# Patient Record
Sex: Female | Born: 1985 | Race: White | Hispanic: No | Marital: Married | Smoking: Never smoker
Health system: Southern US, Community
[De-identification: ages and names within clinical notes are randomized; demographics above are authoritative.]

## PROBLEM LIST (undated history)

## (undated) DIAGNOSIS — Z789 Other specified health status: Secondary | ICD-10-CM

## (undated) HISTORY — DX: Other specified health status: Z78.9

---

## 2015-12-21 HISTORY — PX: DILATION AND CURETTAGE OF UTERUS: SHX78

## 2019-08-13 ENCOUNTER — Other Ambulatory Visit: Payer: Self-pay

## 2019-08-13 DIAGNOSIS — Z20822 Contact with and (suspected) exposure to covid-19: Secondary | ICD-10-CM

## 2019-08-14 LAB — NOVEL CORONAVIRUS, NAA: SARS-CoV-2, NAA: NOT DETECTED

## 2020-04-05 ENCOUNTER — Ambulatory Visit: Payer: Self-pay | Attending: Internal Medicine

## 2020-04-05 DIAGNOSIS — Z23 Encounter for immunization: Secondary | ICD-10-CM

## 2020-04-05 NOTE — Progress Notes (Signed)
   Covid-19 Vaccination Clinic  Name:  Shakela Donati    MRN: [id number] DOB: [birth year]  04/05/2020  Ms. Rovira was observed post Covid-19 immunization for 15 minutes without incident. She was provided with Vaccine Information Sheet and instruction to access the V-Safe system.   Ms. Sobecki was instructed to call 911 with any severe reactions post vaccine: Marland Kitchen Difficulty breathing  . Swelling of face and throat  . A fast heartbeat  . A bad rash all over body  . Dizziness and weakness   Immunizations Administered    Name Date Dose VIS Date Route   Pfizer COVID-19 Vaccine 04/05/2020  1:46 PM 0.3 mL 11/30/2019 Intramuscular   Manufacturer: [employer], [employer]   Lot: [id number]   NDC: [address]-2

## 2020-04-26 ENCOUNTER — Ambulatory Visit: Payer: Self-pay | Attending: Internal Medicine

## 2020-04-26 DIAGNOSIS — Z23 Encounter for immunization: Secondary | ICD-10-CM

## 2020-04-26 NOTE — Progress Notes (Signed)
   Covid-19 Vaccination Clinic  Name:  Janet Ruiz    MRN: [id number] DOB: Mar 15, 1986  04/26/2020  [name] was observed post Covid-19 immunization for 15 minutes without incident. She was provided with Vaccine Information Sheet and instruction to access the V-Safe system.   [name] was instructed to call 911 with any severe reactions post vaccine: Marland Kitchen Difficulty breathing  . Swelling of face and throat  . A fast heartbeat  . A bad rash all over body  . Dizziness and weakness   Immunizations Administered    Name Date Dose VIS Date Route   Pfizer COVID-19 Vaccine 04/26/2020  1:31 PM 0.3 mL 02/13/2019 Intramuscular   Manufacturer: [employer], [employer]   Lot: [id number]   NDC: [address]-4

## 2021-07-28 ENCOUNTER — Other Ambulatory Visit (HOSPITAL_COMMUNITY)
Admission: RE | Admit: 2021-07-28 | Discharge: 2021-07-28 | Disposition: A | Discharge status: Home / Self Care | Payer: Self-pay | Source: Ambulatory Visit | Attending: Obstetrics and Gynecology | Admitting: Obstetrics and Gynecology

## 2021-07-28 ENCOUNTER — Encounter: Payer: Self-pay | Admitting: Obstetrics and Gynecology

## 2021-07-28 ENCOUNTER — Ambulatory Visit (INDEPENDENT_AMBULATORY_CARE_PROVIDER_SITE_OTHER): Payer: 59 | Admitting: Obstetrics and Gynecology

## 2021-07-28 ENCOUNTER — Other Ambulatory Visit: Payer: Self-pay

## 2021-07-28 VITALS — BP 118/74 | HR 83 | Ht 69.0 in | Wt 210.0 lb

## 2021-07-28 DIAGNOSIS — Z124 Encounter for screening for malignant neoplasm of cervix: Secondary | ICD-10-CM | POA: Insufficient documentation

## 2021-07-28 DIAGNOSIS — Z01419 Encounter for gynecological examination (general) (routine) without abnormal findings: Secondary | ICD-10-CM | POA: Insufficient documentation

## 2021-07-28 DIAGNOSIS — Z1331 Encounter for screening for depression: Secondary | ICD-10-CM

## 2021-07-28 DIAGNOSIS — Z1339 Encounter for screening examination for other mental health and behavioral disorders: Secondary | ICD-10-CM

## 2021-07-28 NOTE — Progress Notes (Signed)
Gynecology Annual Exam  PCP: Patient, No Pcp Per (Inactive)  Chief Complaint  Patient presents with   Gynecologic Exam    Annual - no concerns. RM 8    History of Present Illness:  Ms. Janet Ruiz is a 35 y.o. G2P1011 who LMP was Patient's last menstrual period was 07/13/2021., presents today for her annual examination.  Her menses are regular every 30-31 days, lasting 5 day(s).  Dysmenorrhea none. She does not have intermenstrual bleeding.  She is sexually active. She uses condoms. No pain with intercourse.   Last Pap: ~2 years.   Results were: no abnormalities /neg HPV DNA neg Hx of STDs: none  There is a FH of breast cancer in her mat and pat grandmothers. There is no FH of ovarian cancer. The patient does do self-breast exams.  Tobacco use: The patient denies current or previous tobacco use. Alcohol use: social drinker Exercise: 5-6 days per week  The patient wears seatbelts: yes.   The patient reports that domestic violence in her life is absent.   Past Medical History:  Diagnosis Date   No known health problems     Past Surgical History:  Procedure Laterality Date   DILATION AND CURETTAGE OF UTERUS  2017   after miscarriage    Prior to Admission medications   Medication Sig Start Date End Date Taking? Authorizing Provider  clindamycin (CLEOCIN T) 1 % lotion Apply topically every morning. 04/20/21   [provider]    Allergies  Allergen Reactions   Guaifenesin Hives, Itching and Swelling    And hives    Ibuprofen Hives, Itching and Swelling   Obstetric History: G2P1011  Social History   Socioeconomic History   Marital status: Married    Spouse name: Not on file   Number of children: Not on file   Years of education: Not on file   Highest education level: Not on file  Occupational History   Not on file  Tobacco Use   Smoking status: Never   Smokeless tobacco: Never  Vaping Use   Vaping Use: Never used  Substance and Sexual Activity    Alcohol use: Yes    Comment: occasionally   Drug use: Never   Sexual activity: Yes    Birth control/protection: Condom  Other Topics Concern   Not on file  Social History Narrative   Not on file   Social Determinants of Health   Financial Resource Strain: Not on file  Food Insecurity: Not on file  Transportation Needs: Not on file  Physical Activity: Not on file  Stress: Not on file  Social Connections: Not on file  Intimate Partner Violence: Not on file    Family History  Problem Relation Age of Onset   Esophageal cancer Father    Breast cancer Maternal Grandmother    Heart attack Maternal Grandfather    Breast cancer Paternal Grandmother    Stroke Paternal Grandfather    Heart attack Paternal Grandfather     Review of Systems  Constitutional: Negative.   HENT: Negative.    Eyes: Negative.   Respiratory: Negative.    Cardiovascular: Negative.   Gastrointestinal: Negative.   Genitourinary: Negative.   Musculoskeletal: Negative.   Skin: Negative.   Neurological: Negative.   Psychiatric/Behavioral: Negative.      Physical Exam BP 118/74   Pulse 83   Ht 5\' 9"  (1.753 m)   Wt 210 lb (95.3 kg)   LMP 07/13/2021   BMI 31.01 kg/m   Physical Exam  Constitutional:      General: She is not in acute distress.    Appearance: Normal appearance. She is well-developed.  Genitourinary:     Vulva and bladder normal.     Right Labia: No rash, tenderness, lesions, skin changes or Bartholin's cyst.    Left Labia: No tenderness, lesions, skin changes, Bartholin's cyst or rash.    No inguinal adenopathy present in the right or left side.    Pelvic Tanner Score: 5/5.    No vaginal discharge, erythema, tenderness or bleeding.      Right Adnexa: not tender, not full and no mass present.    Left Adnexa: not tender, not full and no mass present.    No cervical motion tenderness, discharge, lesion or polyp.     Uterus is not enlarged or tender.     No uterine mass detected.     Pelvic exam was performed with patient in the lithotomy position.  Breasts:    Right: No inverted nipple, mass, nipple discharge, skin change or tenderness.     Left: No inverted nipple, mass, nipple discharge, skin change or tenderness.  HENT:     Head: Normocephalic and atraumatic.  Eyes:     General: No scleral icterus.    Conjunctiva/sclera: Conjunctivae normal.  Neck:     Thyroid: No thyromegaly.  Cardiovascular:     Rate and Rhythm: Normal rate and regular rhythm.     Heart sounds: No murmur heard.   No friction rub. No gallop.  Pulmonary:     Effort: Pulmonary effort is normal. No respiratory distress.     Breath sounds: Normal breath sounds. No wheezing or rales.  Abdominal:     General: Bowel sounds are normal. There is no distension.     Palpations: Abdomen is soft. There is no mass.     Tenderness: There is no abdominal tenderness. There is no guarding or rebound.     Hernia: There is no hernia in the left inguinal area or right inguinal area.  Musculoskeletal:        General: No swelling or tenderness. Normal range of motion.     Cervical back: Normal range of motion and neck supple.  Lymphadenopathy:     Cervical: No cervical adenopathy.     Lower Body: No right inguinal adenopathy. No left inguinal adenopathy.  Neurological:     General: No focal deficit present.     Mental Status: She is alert and oriented to person, place, and time.     Cranial Nerves: No cranial nerve deficit.  Skin:    General: Skin is warm and dry.     Findings: No erythema or rash.  Psychiatric:        Mood and Affect: Mood normal.        Behavior: Behavior normal.        Judgment: Judgment normal.    Female chaperone present for pelvic and breast  portions of the physical exam  Results: AUDIT Questionnaire (screen for alcoholism): 1 PHQ-9: 1   Assessment: 35 y.o. G41P1011 female here for routine annual gynecologic examination  Plan: Problem List Items Addressed This Visit    None Visit Diagnoses     Women's annual routine gynecological examination    -  Primary   Relevant Orders   Cytology - PAP   Screening for depression       Screening for alcoholism       Pap smear for cervical cancer screening  Relevant Orders   Cytology - PAP      Screening: -- Blood pressure screen normal -- Weight screening: overweight: continue to monitor -- Depression screening negative (PHQ-9) -- Nutrition: normal -- cholesterol screening: not due for screening -- osteoporosis screening: not due -- tobacco screening: not using -- alcohol screening: AUDIT questionnaire indicates low-risk usage. -- family history of breast cancer screening: done. not at high risk. -- no evidence of domestic violence or intimate partner violence. -- STD screening: gonorrhea/chlamydia NAAT not collected per patient request. -- pap smear collected per ASCCP guidelines  Thomasene Mohair, MD 07/28/2021 9:10 AM

## 2021-07-31 LAB — CYTOLOGY - PAP
Comment: NEGATIVE
Diagnosis: NEGATIVE
High risk HPV: NEGATIVE

## 2021-12-15 ENCOUNTER — Encounter: Payer: Self-pay | Admitting: Licensed Practical Nurse

## 2021-12-15 ENCOUNTER — Ambulatory Visit (INDEPENDENT_AMBULATORY_CARE_PROVIDER_SITE_OTHER): Payer: 59 | Admitting: Licensed Practical Nurse

## 2021-12-15 ENCOUNTER — Other Ambulatory Visit: Payer: Self-pay

## 2021-12-15 VITALS — BP 114/70 | Ht 69.0 in | Wt 212.0 lb

## 2021-12-15 DIAGNOSIS — O0990 Supervision of high risk pregnancy, unspecified, unspecified trimester: Secondary | ICD-10-CM | POA: Insufficient documentation

## 2021-12-15 DIAGNOSIS — N926 Irregular menstruation, unspecified: Secondary | ICD-10-CM | POA: Diagnosis not present

## 2021-12-15 DIAGNOSIS — Z3480 Encounter for supervision of other normal pregnancy, unspecified trimester: Secondary | ICD-10-CM

## 2021-12-15 DIAGNOSIS — Z3A08 8 weeks gestation of pregnancy: Secondary | ICD-10-CM | POA: Diagnosis not present

## 2021-12-15 LAB — POCT URINALYSIS DIPSTICK OB
Glucose, UA: NEGATIVE
POC,PROTEIN,UA: NEGATIVE

## 2021-12-15 LAB — POCT URINE PREGNANCY: Preg Test, Ur: POSITIVE — AB

## 2021-12-15 NOTE — Progress Notes (Signed)
New Obstetric Patient H&P    Chief Complaint: "Desires prenatal care"   History of Present Illness: Patient is a 35 y.o. G3P1011 Not Hispanic or Latino female, presents with amenorrhea and positive home pregnancy test. Patient's last menstrual period was 10/20/2021. and based on her  LMP, her EDD is Estimated Date of Delivery 07/27/2022: Cycles last 5 days, are regular, and occur approximately every : 31 days. Her last pap smear was August 2022 and was no abnormalities.    She had a urine pregnancy test which was positive 3 week(s)  ago. Her last menstrual period was normal and lasted for  5 day(s). Since her LMP she claims she has experienced nausea and fatigue. She denies vaginal bleeding. Her past medical history is noncontributory. Her prior pregnancies are notable for  increased blood pressure at the end of pregnancy versus "white coat" syndrome".   Attempted to breastfeed first child but he did not latch, Janet Ruiz did try to pump but never produced more than 2 oz each time despite trying everything, she stopped pumping at 3 months. Reports her breast never got bigger in her last pregnancy.  Breast exam suggestive of IGT-a little wide spaced breast with snoopy shaped breasts. Discussed she could have IGT, but it is worth attempting to breastfeed and see what happens-but she may need to develop a plan to cover the milk she may not be able to produce.   Since her LMP, she admits to the use of tobacco products  yes She claims she has gained   not reviewed  pounds since the start of her pregnancy.  There are cats in the home in the home  NO   She admits close contact with children on a regular basis  yes  She has had chicken pox in the past yes She has had Tuberculosis exposures, symptoms, or previously tested positive for TB   no Current or past history of domestic violence. 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 Patient's age >/= 28 at [facility]  yes 2. Thalassemia ([place], [place], Mediterranean, or Asian background): MCV<80  yes 3. Neural tube defect (meningomyelocele, spina bifida, anencephaly)  no 4. Congenital heart defect  no  5. Down syndrome  no 6. Tay-Sachs (Jewish, [place]  no 7. Canavan's Disease  no 8. Sickle cell disease or trait (African)  no  9. Hemophilia or other blood disorders  no  10. Muscular dystrophy  no  11. Cystic fibrosis  no  12. Huntington's Chorea  no  13. Mental retardation/autism  no 14. Other inherited genetic or chromosomal disorder  no 15. Maternal metabolic disorder (DM, PKU, etc)  no 16. Patient or FOB with a child with a birth defect not listed above no  16a. Patient or FOB with a birth defect themselves no 17. Recurrent pregnancy loss, or stillbirth  no  18. Any medications since LMP other than prenatal vitamins (include vitamins, supplements, OTC meds, drugs, alcohol)  no 19. Any other genetic/environmental exposure to discuss  no  Husband's niece born with cleft lip palate and strabismus   Infection History:   1. Lives with someone with TB or TB exposed  no  2. Patient or partner has history of genital herpes  no 3. Rash  or viral illness since LMP  no 4. History of STI (GC, CT, HPV, syphilis, HIV)  no 5. History of recent travel :  no  Other pertinent information:  no     Review of Systems:10 point review of systems negative unless otherwise noted in HPI  Past Medical History:  There are no problems to display for this patient.   Past Surgical History:  Past Surgical History:  Procedure Laterality Date   DILATION AND CURETTAGE OF UTERUS  2017   after miscarriage    Gynecologic History: Patient's last menstrual period was 07/13/2021.  Obstetric History: G3P1011  Family History:   Family History  Problem Relation Age of Onset   Esophageal cancer Father    Breast cancer Maternal Grandmother    Heart attack Maternal Grandfather    Breast cancer Paternal Grandmother    Stroke Paternal Grandfather    Heart attack Paternal Grandfather     Social History:  Social History   Socioeconomic History   Marital status: Married    Spouse name: Not on file   Number of children: Not on file   Years of education: Not on file   Highest education level: Not on file  Occupational History   Not on file  Tobacco Use   Smoking status: Never   Smokeless tobacco: Never  Vaping Use   Vaping Use: Never used  Substance and Sexual Activity   Alcohol use: Yes    Comment: occasionally   Drug use: Never   Sexual activity: Yes    Birth control/protection: Condom  Other Topics Concern   Not on file  Social History Narrative   Not on file   Social Determinants of Health   Financial Resource Strain: Not on file  Food Insecurity: Not on file  Transportation Needs: Not on file  Physical Activity: Not on file  Stress: Not on file  Social Connections: Not on file  Intimate Partner Violence: Not on file    Allergies:  Allergies  Allergen Reactions   Guaifenesin Hives, Itching and Swelling    And hives    Ibuprofen Hives, Itching and Swelling    Medications: Prior to Admission medications   Medication Sig Start Date End Date Taking? Authorizing Provider  clindamycin (CLEOCIN T) 1 % lotion Apply topically every morning. 04/20/21  Yes [provider]    Physical Exam Vitals: Blood pressure 114/70, height 5\' 9"  (1.753 m), weight 212 lb (96.2 kg), last menstrual period 07/13/2021.  General: NAD HEENT: normocephalic, anicteric Thyroid: no enlargement, no palpable nodules Pulmonary: No increased work of breathing, CTAB Cardiovascular: RRR, distal pulses 2+ Breasts: no redness no masses, see above note regarding IGT  Abdomen: NABS, soft, non-tender,  non-distended.  Umbilicus without lesions.  No hepatomegaly, splenomegaly or masses palpable. No evidence of hernia  Genitourinary: deferred    Rectal: deferred Extremities: no edema, erythema, or tenderness Neurologic: Grossly intact Psychiatric: mood appropriate, affect full   Assessment: 35 y.o. G3P1011 at Unknown presenting to initiate prenatal care  Plan: 1) Avoid alcoholic beverages. 2) Patient encouraged not to smoke.  3) Discontinue the use of all non-medicinal drugs and chemicals.  4) Take prenatal vitamins daily.  5) Nutrition, food safety (fish, cheese advisories, and high nitrite foods) and exercise discussed. 6) Hospital and practice style discussed with cross coverage system.  7) Genetic Screening, such as with 1st Trimester Screening, cell free fetal DNA, AFP testing, and Ultrasound, as well as with amniocentesis and CVS as appropriate, is discussed with patient. At  the conclusion of today's visit patient requested genetic testing 8) Patient is asked about travel to areas at risk for the Zika virus, and counseled to avoid travel and exposure to mosquitoes or sexual partners who may have themselves been exposed to the virus. Testing is discussed, and will be ordered as appropriate.   Most of the prenatal panel with HA1C and CMP collected   Will return in 2 weeks for dating [place] and genetic screening  -Needs routine (no risk factors) UDS and hemoglobin fractionality at next apt.   Carie Caddy, CNM  [facility], [facility] 12/15/2021, 11:41 AM

## 2021-12-16 LAB — CBC/D/PLT+RPR+RH+ABO+RUBIGG...
Antibody Screen: NEGATIVE
Basophils Absolute: 0.1 10*3/uL (ref 0.0–0.2)
Basos: 0 %
EOS (ABSOLUTE): 0.1 10*3/uL (ref 0.0–0.4)
Eos: 0 %
HCV Ab: 0.2 s/co ratio (ref 0.0–0.9)
HIV Screen 4th Generation wRfx: NONREACTIVE
Hematocrit: 39 % (ref 34.0–46.6)
Hemoglobin: 12.6 g/dL (ref 11.1–15.9)
Hepatitis B Surface Ag: NEGATIVE
Immature Grans (Abs): 0 10*3/uL (ref 0.0–0.1)
Immature Granulocytes: 0 %
Lymphocytes Absolute: 2.2 10*3/uL (ref 0.7–3.1)
Lymphs: 17 %
MCH: 29.3 pg (ref 26.6–33.0)
MCHC: 32.3 g/dL (ref 31.5–35.7)
MCV: 91 fL (ref 79–97)
Monocytes Absolute: 0.7 10*3/uL (ref 0.1–0.9)
Monocytes: 6 %
Neutrophils Absolute: 9.4 10*3/uL — ABNORMAL HIGH (ref 1.4–7.0)
Neutrophils: 77 %
Platelets: 342 10*3/uL (ref 150–450)
RBC: 4.3 x10E6/uL (ref 3.77–5.28)
RDW: 11.8 % (ref 11.7–15.4)
RPR Ser Ql: NONREACTIVE
Rh Factor: POSITIVE
Rubella Antibodies, IGG: 1.19 index (ref >0.99)
Varicella zoster IgG: 1031 index (ref >165)
WBC: 12.4 10*3/uL — ABNORMAL HIGH (ref 3.4–10.8)

## 2021-12-16 LAB — COMPREHENSIVE METABOLIC PANEL
ALT: 51 IU/L — ABNORMAL HIGH (ref 0–32)
AST: 36 IU/L (ref 0–40)
Albumin/Globulin Ratio: 1.9 (ref 1.2–2.2)
Albumin: 4.6 g/dL (ref 3.8–4.8)
Alkaline Phosphatase: 84 IU/L (ref 44–121)
BUN/Creatinine Ratio: 15 (ref 9–23)
BUN: 9 mg/dL (ref 6–20)
Bilirubin Total: 0.3 mg/dL (ref 0.0–1.2)
CO2: 22 mmol/L (ref 20–29)
Calcium: 9.3 mg/dL (ref 8.7–10.2)
Chloride: 102 mmol/L (ref 96–106)
Creatinine, Ser: 0.61 mg/dL (ref 0.57–1.00)
Globulin, Total: 2.4 g/dL (ref 1.5–4.5)
Glucose: 106 mg/dL — ABNORMAL HIGH (ref 70–99)
Potassium: 4.7 mmol/L (ref 3.5–5.2)
Sodium: 138 mmol/L (ref 134–144)
Total Protein: 7 g/dL (ref 6.0–8.5)
eGFR: 120 mL/min/{1.73_m2} (ref >59)

## 2021-12-16 LAB — HEMOGLOBIN A1C
Est. average glucose Bld gHb Est-mCnc: 114 mg/dL
Hgb A1c MFr Bld: 5.6 % (ref 4.8–5.6)

## 2021-12-16 LAB — HCV INTERPRETATION

## 2021-12-17 ENCOUNTER — Encounter: Payer: Self-pay | Admitting: Licensed Practical Nurse

## 2021-12-17 ENCOUNTER — Other Ambulatory Visit: Payer: Self-pay | Admitting: Licensed Practical Nurse

## 2021-12-17 DIAGNOSIS — R748 Abnormal levels of other serum enzymes: Secondary | ICD-10-CM

## 2021-12-17 LAB — GC/CHLAMYDIA PROBE AMP
Chlamydia trachomatis, NAA: NEGATIVE
Neisseria Gonorrhoeae by PCR: NEGATIVE

## 2021-12-17 NOTE — Progress Notes (Signed)
Elevated ALT at NOB, liver US ordered, message sent through mychart to [facility] discussing plan.   Carie Caddy, CNM  [facility], [facility]  12/17/21  8:42 AM

## 2021-12-18 ENCOUNTER — Telehealth: Payer: Self-pay

## 2021-12-18 LAB — URINE CULTURE: Organism ID, Bacteria: NO GROWTH

## 2021-12-18 NOTE — Telephone Encounter (Signed)
Pt calling; is 8wks; has questions about genetic testing - the difference between them; has done Natera before and like it.  [phone]  Adv pt Inheritest is a carrier screening test for cystic fibrosis, spinal muscular atrophy and Fragile X; MaterniT21 tests for certain chromasomal abnormalities such at trisomy 18,21, 13,16, and 22. Adv MaterniT21 give gender whereas Inheritest does not; MaterniT21 is the most popular in our practice.  Pt asked about Gnomes and if we used Natera.  Adv to ask provider at her next appt about the Gnomes; adv we used to do natera but not sure we have the kits anymore. Pt states she'll go with MaterniT21.

## 2021-12-20 NOTE — L&D Delivery Note (Signed)
Obstetrical Delivery Note   Date of Delivery:   08/04/2022 Primary OB:   [facility] Gestational Age/EDD: [redacted]w[redacted]d (Dated by Corinna Capra) Reason for Admission: Postdate IOL Antepartum complications: AMA  Delivered By:   Siri Cole, CNM   Delivery Type:   spontaneous vaginal delivery  Delivery Details:   Admitted for postdates induction.  SVE 4-5/50/-2, Started on Pitocin at 1100.  AROM for mec around 1445, soon after epidural felt need to push, VE was anterior lip, while on right side gently pushed through a few contractions, 10cm at 0 to +1 station at 1618. Pt pushed effectively with each contraction repositioned to Left side, SVB of viable female at 1636, infant birthed OA to ROA with a compound hand. Shoulders followed quickly after birth. To mother's abd, vigorous cry, quick to pink up HR >100. Cord doubly clamped and cut by dad once pulsation ceased. Placenta delivered with maternal effort. On inspection of perineum a 2nd degree perineal laceration found and repaired with 2-0 Vicryl. Area hemostatic. Infant skin to skin with mother, both stable.  Anesthesia:    epidural Intrapartum complications: None GBS:    Negative Laceration:    2nd degree Episiotomy:    none Rectal exam:   deferred Placenta:    Delivered and expressed via active management. Intact: yes. To pathology: yes.  Delayed Cord Clamping: yes Estimated Blood Loss:   Baby:    Liveborn female, APGARs 9/10, weight pending gm  Carie Caddy, CNM  [facility], [facility]  @TODAY @  5:24 PM

## 2021-12-29 ENCOUNTER — Other Ambulatory Visit: Payer: Self-pay | Admitting: Licensed Practical Nurse

## 2021-12-29 ENCOUNTER — Ambulatory Visit
Admission: RE | Admit: 2021-12-29 | Discharge: 2021-12-29 | Disposition: A | Discharge status: Home / Self Care | Payer: 59 | Source: Ambulatory Visit | Attending: Licensed Practical Nurse | Admitting: Licensed Practical Nurse

## 2021-12-29 DIAGNOSIS — Z3481 Encounter for supervision of other normal pregnancy, first trimester: Secondary | ICD-10-CM | POA: Diagnosis not present

## 2021-12-29 DIAGNOSIS — Z3480 Encounter for supervision of other normal pregnancy, unspecified trimester: Secondary | ICD-10-CM | POA: Diagnosis present

## 2021-12-30 ENCOUNTER — Encounter: Payer: Self-pay | Admitting: Licensed Practical Nurse

## 2021-12-30 ENCOUNTER — Telehealth: Payer: Self-pay

## 2021-12-30 NOTE — Telephone Encounter (Signed)
Pt calling; had 1st u/s yesterday - dating u/s - will be [redacted]w[redacted]d tomorrow; MaterniT21 lab is scheduled for be drawn tomorrow; is this ok or too early?   [phone]

## 2021-12-31 ENCOUNTER — Other Ambulatory Visit: Payer: Self-pay

## 2022-01-01 ENCOUNTER — Ambulatory Visit
Admission: RE | Admit: 2022-01-01 | Discharge: 2022-01-01 | Disposition: A | Discharge status: Home / Self Care | Payer: 59 | Source: Ambulatory Visit | Attending: Licensed Practical Nurse | Admitting: Licensed Practical Nurse

## 2022-01-01 ENCOUNTER — Encounter: Payer: Self-pay | Admitting: Licensed Practical Nurse

## 2022-01-01 ENCOUNTER — Other Ambulatory Visit: Payer: Self-pay | Admitting: Licensed Practical Nurse

## 2022-01-01 DIAGNOSIS — R748 Abnormal levels of other serum enzymes: Secondary | ICD-10-CM | POA: Insufficient documentation

## 2022-01-01 DIAGNOSIS — Z1379 Encounter for other screening for genetic and chromosomal anomalies: Secondary | ICD-10-CM

## 2022-01-01 DIAGNOSIS — Z3480 Encounter for supervision of other normal pregnancy, unspecified trimester: Secondary | ICD-10-CM

## 2022-01-04 ENCOUNTER — Other Ambulatory Visit: Payer: 59

## 2022-01-04 ENCOUNTER — Other Ambulatory Visit: Payer: Self-pay

## 2022-01-04 DIAGNOSIS — Z3480 Encounter for supervision of other normal pregnancy, unspecified trimester: Secondary | ICD-10-CM

## 2022-01-04 DIAGNOSIS — Z1379 Encounter for other screening for genetic and chromosomal anomalies: Secondary | ICD-10-CM

## 2022-01-07 LAB — HGB FRACTIONATION CASCADE
Hgb A2: 2.9 % (ref 1.8–3.2)
Hgb A: 97.1 % (ref 96.4–98.8)
Hgb F: 0 % (ref 0.0–2.0)
Hgb S: 0 %

## 2022-01-08 ENCOUNTER — Encounter: Payer: Self-pay | Admitting: Licensed Practical Nurse

## 2022-01-08 LAB — MATERNIT 21 PLUS CORE, BLOOD
Fetal Fraction: 11
Result (T21): NEGATIVE
Trisomy 13 (Patau syndrome): NEGATIVE
Trisomy 18 (Edwards syndrome): NEGATIVE
Trisomy 21 (Down syndrome): NEGATIVE

## 2022-01-15 ENCOUNTER — Ambulatory Visit (INDEPENDENT_AMBULATORY_CARE_PROVIDER_SITE_OTHER): Payer: 59 | Admitting: Licensed Practical Nurse

## 2022-01-15 ENCOUNTER — Encounter: Payer: Self-pay | Admitting: Licensed Practical Nurse

## 2022-01-15 ENCOUNTER — Other Ambulatory Visit: Payer: Self-pay

## 2022-01-15 VITALS — BP 110/60 | Wt 208.0 lb

## 2022-01-15 DIAGNOSIS — Z3A12 12 weeks gestation of pregnancy: Secondary | ICD-10-CM

## 2022-01-15 DIAGNOSIS — Z3480 Encounter for supervision of other normal pregnancy, unspecified trimester: Secondary | ICD-10-CM

## 2022-01-15 NOTE — Progress Notes (Signed)
Routine Prenatal Care Visit  Subjective  Janet Ruiz is a 36 y.o. G3P1011 at [redacted]w[redacted]d being seen today for ongoing prenatal care.  She is currently monitored for the following issues for this low-risk pregnancy and has Supervision of other normal pregnancy, antepartum on their problem list.  ----------------------------------------------------------------------------------- Patient reports no complaints. Occasional nausea, but otherwise feels good.  Traveling to [place] soon to help care for her sister's new baby.  Travel precautions reviewed.   .  .   Pincus Large Fluid denies.  ----------------------------------------------------------------------------------- The following portions of the patient's history were reviewed and updated as appropriate: allergies, current medications, past family history, past medical history, past social history, past surgical history and problem list. Problem list updated.  Objective  Blood pressure 110/60, weight 208 lb (94.3 kg), last menstrual period 10/20/2021. Pregravid weight 212 lb (96.2 kg) Total Weight Gain -4 lb (-1.814 kg) Urinalysis: Urine Protein    Urine Glucose    Fetal Status:           General:  Alert, oriented and cooperative. Patient is in no acute distress.  Skin: Skin is warm and dry. No rash noted.   Cardiovascular: Normal heart rate noted  Respiratory: Normal respiratory effort, no problems with respiration noted  Abdomen: Soft, gravid, appropriate for gestational age.       Pelvic:  Cervical exam deferred        Extremities: Normal range of motion.     Mental Status: Normal mood and affect. Normal behavior. Normal judgment and thought content.   Assessment   36 y.o. G3P1011 at [redacted]w[redacted]d by  07/27/2022, by Last Menstrual Period presenting for routine prenatal visit  Plan   prgenancy 3 Problems (from 12/15/21 to present)     Problem Noted Resolved   Supervision of other normal pregnancy, antepartum 12/15/2021 by Ellwood Sayers, CNM No   Overview Signed 12/15/2021 11:57 AM by Ellwood Sayers, CNM     Nursing Staff Provider  Office Location  [facility] Dating    Language  English Anatomy US    Flu Vaccine  2022 Genetic Screen  NIPS:   TDaP vaccine    Hgb A1C or  GTT Early : Third trimester :   Covid 03/2020    LAB RESULTS   Rhogam   Blood Type     Feeding Plan  Antibody    Contraception  Rubella    Circumcision  RPR     Pediatrician   HBsAg     Support Person  HIV    Prenatal Classes  Varicella     GBS  (For PCN allergy, check sensitivities)   BTL Consent     VBAC Consent  Pap      Hgb Electro    Pelvis Tested  CF      SMA                    General obstetric precautions including but not limited to vaginal bleeding, contractions, leaking of fluid and fetal movement were reviewed in detail with the patient. Please refer to After Visit Summary for other counseling recommendations.   Return in about 4 weeks (around 02/12/2022) for ROB.  Anatomy US ordered  Carie Caddy, CNM  [facility], [facility]  01/15/22  10:38 AM

## 2022-01-15 NOTE — Progress Notes (Signed)
ROB- no concerns 

## 2022-02-19 ENCOUNTER — Encounter: Payer: Self-pay | Admitting: Licensed Practical Nurse

## 2022-02-19 ENCOUNTER — Ambulatory Visit (INDEPENDENT_AMBULATORY_CARE_PROVIDER_SITE_OTHER): Payer: 59 | Admitting: Licensed Practical Nurse

## 2022-02-19 ENCOUNTER — Other Ambulatory Visit: Payer: Self-pay

## 2022-02-19 VITALS — BP 118/62 | Wt 208.0 lb

## 2022-02-19 DIAGNOSIS — Z3A17 17 weeks gestation of pregnancy: Secondary | ICD-10-CM

## 2022-02-19 DIAGNOSIS — Z3480 Encounter for supervision of other normal pregnancy, unspecified trimester: Secondary | ICD-10-CM

## 2022-02-19 LAB — POCT URINALYSIS DIPSTICK OB
Glucose, UA: NEGATIVE
POC,PROTEIN,UA: NEGATIVE

## 2022-02-19 NOTE — Addendum Note (Signed)
Addended by: Burtis Junes on: 02/19/2022 08:37 AM ? ? Modules accepted: Orders ? ?

## 2022-02-19 NOTE — Progress Notes (Signed)
Routine Prenatal Care Visit ? ?Subjective  ?Janet Ruiz is a 36 y.o. G3P1011 at [redacted]w[redacted]d being seen today for ongoing prenatal care.  She is currently monitored for the following issues for this low-risk pregnancy and has Supervision of other normal pregnancy, antepartum on their problem list.  ?----------------------------------------------------------------------------------- ?Patient reports nausea improving, only present when she is brushing her teeth.  Modo is good.  Has been walking.  Plans to fly to [place] at some point.   ?Contractions: Not present. Vag. Bleeding: None.   . Leaking Fluid denies.  ?----------------------------------------------------------------------------------- ?The following portions of the patient's history were reviewed and updated as appropriate: allergies, current medications, past family history, past medical history, past social history, past surgical history and problem list. Problem list updated. ? ?Objective  ?Blood pressure 118/62, weight 208 lb (94.3 kg), last menstrual period 10/20/2021. ?Pregravid weight 212 lb (96.2 kg) Total Weight Gain -4 lb (-1.814 kg) ?Urinalysis: Urine Protein    Urine Glucose   ? ?Fetal Status: Fetal Heart Rate (bpm): 160        ? ?General:  Alert, oriented and cooperative. Patient is in no acute distress.  ?Skin: Skin is warm and dry. No rash noted.   ?Cardiovascular: Normal heart rate noted  ?Respiratory: Normal respiratory effort, no problems with respiration noted  ?Abdomen: Soft, gravid, appropriate for gestational age. Pain/Pressure: Absent     ?Pelvic:  Cervical exam deferred        ?Extremities: Normal range of motion.     ?Mental Status: Normal mood and affect. Normal behavior. Normal judgment and thought content.  ? ?Assessment  ? ?36 y.o. G3P1011 at [redacted]w[redacted]d by  07/27/2022, by Last Menstrual Period presenting for routine prenatal visit ? ?Plan  ? ?prgenancy 3 Problems (from 12/15/21 to present)   ? ? Problem Noted Resolved  ? Supervision  of other normal pregnancy, antepartum 12/15/2021 by Ellwood Sayers, CNM No  ? Overview Addendum 01/15/2022  8:35 AM by Ellwood Sayers, CNM  ?   ?Nursing Staff Provider  ?Office Location  [facility] Dating  [redacted]w[redacted]d 12/29/21  ?Language  English Anatomy US    ?Flu Vaccine  2022 Genetic Screen  NIPS: neg, XY  ?TDaP vaccine    Hgb A1C or  ?GTT Early :5.6Third trimester :   ?Covid 03/2020    LAB RESULTS   ?Rhogam  NA Blood Type B/Positive/-- (12/27 1041)   ?Feeding Plan breast Antibody Negative (12/27 1041)  ?Contraception  Rubella 1.19 (12/27 1041)  ?Circumcision  RPR Non Reactive (12/27 1041)   ?Pediatrician   HBsAg Negative (12/27 1041)   ?Support Person  HIV Non Reactive (12/27 1041)  ?Prenatal Classes  Varicella immune  ?  GBS  (For PCN allergy, check sensitivities)   ?BTL Consent     ?VBAC Consent  Pap  neg 2022  ?  Hgb Electro    ?Pelvis Tested  CF   ?   SMA   ?     ? ? ?  ?  ? ?  ?  ?general obstetric precautions including but not limited to vaginal bleeding, contractions, leaking of fluid and fetal movement were reviewed in detail with the patient. ?Please refer to After Visit Summary for other counseling recommendations.  ? ?Return in about 4 weeks (around 03/19/2022) for ROB. ? ?Anatomy US scheduled 3/10 ?Carie Caddy, CNM  ?Domingo Pulse, [facility]  ?02/19/22  ?8:32 AM  ? ? ?

## 2022-02-26 ENCOUNTER — Ambulatory Visit
Admission: RE | Admit: 2022-02-26 | Discharge: 2022-02-26 | Disposition: A | Discharge status: Home / Self Care | Payer: 59 | Source: Ambulatory Visit | Attending: Licensed Practical Nurse | Admitting: Licensed Practical Nurse

## 2022-02-26 DIAGNOSIS — Z3480 Encounter for supervision of other normal pregnancy, unspecified trimester: Secondary | ICD-10-CM

## 2022-02-26 DIAGNOSIS — Z3689 Encounter for other specified antenatal screening: Secondary | ICD-10-CM | POA: Insufficient documentation

## 2022-02-26 DIAGNOSIS — Z3A18 18 weeks gestation of pregnancy: Secondary | ICD-10-CM | POA: Insufficient documentation

## 2022-03-03 ENCOUNTER — Other Ambulatory Visit: Payer: Self-pay | Admitting: Licensed Practical Nurse

## 2022-03-03 ENCOUNTER — Encounter: Payer: Self-pay | Admitting: Licensed Practical Nurse

## 2022-03-03 DIAGNOSIS — Z3482 Encounter for supervision of other normal pregnancy, second trimester: Secondary | ICD-10-CM

## 2022-03-17 ENCOUNTER — Ambulatory Visit
Admission: RE | Admit: 2022-03-17 | Discharge: 2022-03-17 | Disposition: A | Discharge status: Home / Self Care | Payer: 59 | Source: Ambulatory Visit | Attending: Licensed Practical Nurse | Admitting: Licensed Practical Nurse

## 2022-03-17 DIAGNOSIS — Z3A20 20 weeks gestation of pregnancy: Secondary | ICD-10-CM | POA: Diagnosis not present

## 2022-03-17 DIAGNOSIS — Z3689 Encounter for other specified antenatal screening: Secondary | ICD-10-CM | POA: Diagnosis not present

## 2022-03-17 DIAGNOSIS — Z3482 Encounter for supervision of other normal pregnancy, second trimester: Secondary | ICD-10-CM | POA: Insufficient documentation

## 2022-03-19 ENCOUNTER — Ambulatory Visit (INDEPENDENT_AMBULATORY_CARE_PROVIDER_SITE_OTHER): Payer: 59 | Admitting: Obstetrics

## 2022-03-19 VITALS — BP 126/74 | Wt 209.0 lb

## 2022-03-19 DIAGNOSIS — Z3A34 34 weeks gestation of pregnancy: Secondary | ICD-10-CM

## 2022-03-19 DIAGNOSIS — Z3480 Encounter for supervision of other normal pregnancy, unspecified trimester: Secondary | ICD-10-CM

## 2022-03-19 NOTE — Progress Notes (Signed)
Anatomy scan f/u. No vb. No lof.  

## 2022-03-19 NOTE — Progress Notes (Signed)
Routine Prenatal Care Visit ? ?Subjective  ?Summar Janet Ruiz is a 36 y.o. G3P1011 at [redacted]w[redacted]d being seen today for ongoing prenatal care.  She is currently monitored for the following issues for this low-risk pregnancy and has Supervision of other normal pregnancy, antepartum on their problem list.  ?----------------------------------------------------------------------------------- ?Patient reports no complaints.   ?Contractions: Not present. Vag. Bleeding: None.   . Leaking Fluid denies.  ?----------------------------------------------------------------------------------- ?The following portions of the patient's history were reviewed and updated as appropriate: allergies, current medications, past family history, past medical history, past social history, past surgical history and problem list. Problem list updated. ? ?Objective  ?Blood pressure 126/74, weight 209 lb (94.8 kg), last menstrual period 10/20/2021. ?Pregravid weight 212 lb (96.2 kg) Total Weight Gain -3 lb (-1.361 kg) ?Urinalysis: Urine Protein    Urine Glucose   ? ?Fetal Status:          ? ?General:  Alert, oriented and cooperative. Patient is in no acute distress.  ?Skin: Skin is warm and dry. No rash noted.   ?Cardiovascular: Normal heart rate noted  ?Respiratory: Normal respiratory effort, no problems with respiration noted  ?Abdomen: Soft, gravid, appropriate for gestational age. Pain/Pressure: Absent     ?Pelvic:  Cervical exam deferred        ?Extremities: Normal range of motion.     ?Mental Status: Normal mood and affect. Normal behavior. Normal judgment and thought content.  ? ?Assessment  ? ?36 y.o. G3P1011 at [redacted]w[redacted]d by  07/27/2022, by Last Menstrual Period presenting for routine prenatal visit ? ?Plan  ? ?prgenancy 3 Problems (from 12/15/21 to present)   ? Problem Noted Resolved  ? Supervision of other normal pregnancy, antepartum 12/15/2021 by Allen Derry, CNM No  ? Overview Addendum 03/19/2022  8:25 AM by Imagene Riches, CNM  ?    ?Nursing Staff Provider  ?Office Location  [facility] Dating  [redacted]w[redacted]d 12/29/21  ?Language  English Anatomy US  Normal female  ?Flu Vaccine  2022 Genetic Screen  NIPS: neg, XY  ?TDaP vaccine    Hgb A1C or  ?GTT Early :5.6Third trimester :   ?Covid 03/2020    LAB RESULTS   ?Rhogam  NA Blood Type B/Positive/-- (12/27 1041)   ?Feeding Plan breast Antibody Negative (12/27 1041)  ?Contraception  Rubella 1.19 (12/27 1041)  ?Circumcision  RPR Non Reactive (12/27 1041)   ?Pediatrician   HBsAg Negative (12/27 1041)   ?Support Person  HIV Non Reactive (12/27 1041)  ?Prenatal Classes  Varicella immune  ?  GBS  (For PCN allergy, check sensitivities)   ?BTL Consent     ?VBAC Consent  Pap  neg 2022  ?  Hgb Electro    ?Pelvis Tested  CF   ?   SMA   ?     ? ? ?  ?  ?  ?  ? ?Preterm labor symptoms and general obstetric precautions including but not limited to vaginal bleeding, contractions, leaking of fluid and fetal movement were reviewed in detail with the patient. ?Please refer to After Visit Summary for other counseling recommendations.  ?We reviewed her anatomy scan. ? ?Return in about 4 weeks (around 04/16/2022) for return OB. ? ?Imagene Riches, CNM  ?03/19/2022 8:25 AM   ? ?

## 2022-04-13 ENCOUNTER — Ambulatory Visit (INDEPENDENT_AMBULATORY_CARE_PROVIDER_SITE_OTHER): Payer: 59 | Admitting: Obstetrics

## 2022-04-13 VITALS — BP 122/70 | Wt 213.0 lb

## 2022-04-13 DIAGNOSIS — Z3480 Encounter for supervision of other normal pregnancy, unspecified trimester: Secondary | ICD-10-CM

## 2022-04-13 DIAGNOSIS — Z3A25 25 weeks gestation of pregnancy: Secondary | ICD-10-CM

## 2022-04-13 NOTE — Progress Notes (Signed)
Routine Prenatal Care Visit ? ?Subjective  ?Janet Ruiz is a 36 y.o. G3P1011 at [redacted]w[redacted]d being seen today for ongoing prenatal care.  She is currently monitored for the following issues for this low-risk pregnancy and has Supervision of other normal pregnancy, antepartum on their problem list.  ?----------------------------------------------------------------------------------- ?Patient reports no complaints.  She is taking vacation in a few weeks. Will adjust when she does her 28 wk labs. Still deciding on a name. She likes Neldon Labella; husband is not so sure. ?Contractions: Not present. Vag. Bleeding: None.  Movement: Present. Leaking Fluid denies.  ?----------------------------------------------------------------------------------- ?The following portions of the patient's history were reviewed and updated as appropriate: allergies, current medications, past family history, past medical history, past social history, past surgical history and problem list. Problem list updated. ? ?Objective  ?Blood pressure 122/70, weight 213 lb (96.6 kg), last menstrual period 10/20/2021. ?Pregravid weight 212 lb (96.2 kg) Total Weight Gain 1 lb (0.454 kg) ?Urinalysis: Urine Protein    Urine Glucose   ? ?Fetal Status:     Movement: Present    ? ?General:  Alert, oriented and cooperative. Patient is in no acute distress.  ?Skin: Skin is warm and dry. No rash noted.   ?Cardiovascular: Normal heart rate noted  ?Respiratory: Normal respiratory effort, no problems with respiration noted  ?Abdomen: Soft, gravid, appropriate for gestational age. Pain/Pressure: Absent     ?Pelvic:  Cervical exam deferred        ?Extremities: Normal range of motion.     ?Mental Status: Normal mood and affect. Normal behavior. Normal judgment and thought content.  ? ?Assessment  ? ?36 y.o. G3P1011 at [redacted]w[redacted]d by  07/27/2022, by Last Menstrual Period presenting for routine prenatal visit ? ?Plan  ? ?prgenancy 3 Problems (from 12/15/21 to present)   ? Problem Noted  Resolved  ? Supervision of other normal pregnancy, antepartum 12/15/2021 by Ellwood Sayers, CNM No  ? Overview Addendum 03/19/2022  8:25 AM by Mirna Mires, CNM  ?   ?Nursing Staff Provider  ?Office Location  [facility] Dating  [redacted]w[redacted]d 12/29/21  ?Language  English Anatomy US  Normal female  ?Flu Vaccine  2022 Genetic Screen  NIPS: neg, XY  ?TDaP vaccine    Hgb A1C or  ?GTT Early :5.6Third trimester :   ?Covid 03/2020    LAB RESULTS   ?Rhogam  NA Blood Type B/Positive/-- (12/27 1041)   ?Feeding Plan breast Antibody Negative (12/27 1041)  ?Contraception  Rubella 1.19 (12/27 1041)  ?Circumcision  RPR Non Reactive (12/27 1041)   ?Pediatrician   HBsAg Negative (12/27 1041)   ?Support Person  HIV Non Reactive (12/27 1041)  ?Prenatal Classes  Varicella immune  ?  GBS  (For PCN allergy, check sensitivities)   ?BTL Consent     ?VBAC Consent  Pap  neg 2022  ?  Hgb Electro    ?Pelvis Tested  CF   ?   SMA   ?     ? ? ?  ?  ?  ?  ? ?Preterm labor symptoms and general obstetric precautions including but not limited to vaginal bleeding, contractions, leaking of fluid and fetal movement were reviewed in detail with the patient. ?Please refer to After Visit Summary for other counseling recommendations.  ? ?Return in about 3 weeks (around 05/04/2022) for return OB, glucose testing. ? ?Mirna Mires, CNM  ?04/13/2022 10:39 AM   ? ?

## 2022-04-13 NOTE — Progress Notes (Signed)
No vb. No lof.  

## 2022-04-17 ENCOUNTER — Encounter: Payer: Self-pay | Admitting: Obstetrics

## 2022-04-26 ENCOUNTER — Encounter: Payer: 59 | Admitting: Licensed Practical Nurse

## 2022-04-26 ENCOUNTER — Other Ambulatory Visit: Payer: 59

## 2022-04-27 ENCOUNTER — Encounter: Payer: Self-pay | Admitting: Family Medicine

## 2022-04-27 ENCOUNTER — Other Ambulatory Visit: Payer: 59

## 2022-04-27 ENCOUNTER — Encounter: Payer: 59 | Admitting: Family Medicine

## 2022-04-27 ENCOUNTER — Ambulatory Visit (INDEPENDENT_AMBULATORY_CARE_PROVIDER_SITE_OTHER): Payer: 59 | Admitting: Family Medicine

## 2022-04-27 VITALS — BP 120/62 | Wt 213.0 lb

## 2022-04-27 DIAGNOSIS — Z3A27 27 weeks gestation of pregnancy: Secondary | ICD-10-CM | POA: Diagnosis not present

## 2022-04-27 DIAGNOSIS — O09523 Supervision of elderly multigravida, third trimester: Secondary | ICD-10-CM

## 2022-04-27 DIAGNOSIS — Z3480 Encounter for supervision of other normal pregnancy, unspecified trimester: Secondary | ICD-10-CM | POA: Diagnosis not present

## 2022-04-27 DIAGNOSIS — Z23 Encounter for immunization: Secondary | ICD-10-CM | POA: Diagnosis not present

## 2022-04-27 DIAGNOSIS — O0990 Supervision of high risk pregnancy, unspecified, unspecified trimester: Secondary | ICD-10-CM

## 2022-04-27 DIAGNOSIS — O99210 Obesity complicating pregnancy, unspecified trimester: Secondary | ICD-10-CM | POA: Insufficient documentation

## 2022-04-27 DIAGNOSIS — O09529 Supervision of elderly multigravida, unspecified trimester: Secondary | ICD-10-CM | POA: Insufficient documentation

## 2022-04-27 DIAGNOSIS — Z8759 Personal history of other complications of pregnancy, childbirth and the puerperium: Secondary | ICD-10-CM | POA: Insufficient documentation

## 2022-04-27 LAB — POCT URINALYSIS DIPSTICK OB
Glucose, UA: NEGATIVE
POC,PROTEIN,UA: NEGATIVE

## 2022-04-27 NOTE — Progress Notes (Signed)
? ? ?  PRENATAL VISIT NOTE ? ?Subjective:  ?Janet Ruiz is a 36 y.o. G3P1011 at [redacted]w[redacted]d being seen today for ongoing prenatal care.  She is currently monitored for the following issues for this low-risk pregnancy and has Supervision of high risk pregnancy, antepartum; AMA (advanced maternal age) multigravida 39+; History of 3rd/4th degree laceration ; Elevated BMI in pregnancy; and History of pregnancy induced hypertension on their problem list. ? ?Patient reports no complaints.  Contractions: Irritability. Vag. Bleeding: None.  Movement: Present. Denies leaking of fluid.  ? ?The following portions of the patient's history were reviewed and updated as appropriate: allergies, current medications, past family history, past medical history, past social history, past surgical history and problem list.  ? ?Objective:  ? ?Vitals:  ? 04/27/22 1027  ?BP: 120/62  ?Weight: 213 lb (96.6 kg)  ? ? ?Fetal Status: Fetal Heart Rate (bpm): 150 Fundal Height: 27 cm Movement: Present    ? ?General:  Alert, oriented and cooperative. Patient is in no acute distress.  ?Skin: Skin is warm and dry. No rash noted.   ?Cardiovascular: Normal heart rate noted  ?Respiratory: Normal respiratory effort, no problems with respiration noted  ?Abdomen: Soft, gravid, appropriate for gestational age.  Pain/Pressure: Absent     ?Pelvic: Cervical exam deferred        ?Extremities: Normal range of motion.  Edema: None  ?Mental Status: Normal mood and affect. Normal behavior. Normal judgment and thought content.  ? ?Assessment and Plan:  ?Pregnancy: G3P1011 at [redacted]w[redacted]d ? ?1. Supervision of other normal pregnancy, antepartum ?- POC Urinalysis Dipstick OB ?- Tdap vaccine greater than or equal to 7yo IM ? ?2. [redacted] weeks gestation of pregnancy ?- POC Urinalysis Dipstick OB ?- Tdap vaccine greater than or equal to 7yo IM ? ?3. Multigravida of advanced maternal age in third trimester ?Patient was never started on ASA  ?NIPS low risk ?Unlikely to be beneficial  now ?Had [facility] ? ?4. Supervision of high risk pregnancy, antepartum ?Changed to HR code due to BMI, AMA, history of PIH ?Informed patient of change  ?Going to [place] for the weekend, discussed getting out and walking every 2 hrs which will be no problem with a 5 yo and pregnant bladder :) ? ?5. Obstetric vaginal laceration with third degree perineal laceration, unspecified subtype ?Reviewed that patient had a 3rd/4th degree with last delivery ? ?6. Elevated BMI in pregnancy ?TWG=1 lb (0.454 kg) which is low normal but within 11-15lg goal ? ?7. History of pregnancy induced hypertension ?BP today is WNL ? ? ?Preterm labor symptoms and general obstetric precautions including but not limited to vaginal bleeding, contractions, leaking of fluid and fetal movement were reviewed in detail with the patient. ?Please refer to After Visit Summary for other counseling recommendations.  ? ?Return in about 2 weeks (around 05/11/2022) for Routine prenatal care. ? ?Future Appointments  ?Date Time Provider [place]  ?05/11/2022  9:35 AM Caren Macadam, MD [facility] None  ?05/25/2022  8:35 AM Caren Macadam, MD [facility] None  ?06/08/2022  8:15 AM Imagene Riches, CNM [facility] None  ? ? ?Caren Macadam, MD ?

## 2022-04-28 LAB — 28 WEEK RH+PANEL
Basophils Absolute: 0.1 10*3/uL (ref 0.0–0.2)
Basos: 0 %
EOS (ABSOLUTE): 0.1 10*3/uL (ref 0.0–0.4)
Eos: 1 %
Gestational Diabetes Screen: 128 mg/dL (ref 70–139)
HIV Screen 4th Generation wRfx: NONREACTIVE
Hematocrit: 32.5 % — ABNORMAL LOW (ref 34.0–46.6)
Hemoglobin: 10.7 g/dL — ABNORMAL LOW (ref 11.1–15.9)
Immature Grans (Abs): 0 10*3/uL (ref 0.0–0.1)
Immature Granulocytes: 0 %
Lymphocytes Absolute: 1.7 10*3/uL (ref 0.7–3.1)
Lymphs: 15 %
MCH: 29.6 pg (ref 26.6–33.0)
MCHC: 32.9 g/dL (ref 31.5–35.7)
MCV: 90 fL (ref 79–97)
Monocytes Absolute: 0.4 10*3/uL (ref 0.1–0.9)
Monocytes: 4 %
Neutrophils Absolute: 8.9 10*3/uL — ABNORMAL HIGH (ref 1.4–7.0)
Neutrophils: 80 %
Platelets: 310 10*3/uL (ref 150–450)
RBC: 3.61 x10E6/uL — ABNORMAL LOW (ref 3.77–5.28)
RDW: 11.9 % (ref 11.7–15.4)
RPR Ser Ql: NONREACTIVE
WBC: 11.3 10*3/uL — ABNORMAL HIGH (ref 3.4–10.8)

## 2022-04-28 IMAGING — US US ABDOMEN LIMITED
1 series · 15 of 25 positions shown · non-contrast
Comparison: None.

CLINICAL DATA: Elevated liver enzymes

EXAM:
ULTRASOUND ABDOMEN LIMITED RIGHT UPPER QUADRANT

[Series 1: us abdomen limited ruq · 15 of 40 slices shown]
[im 1/40]
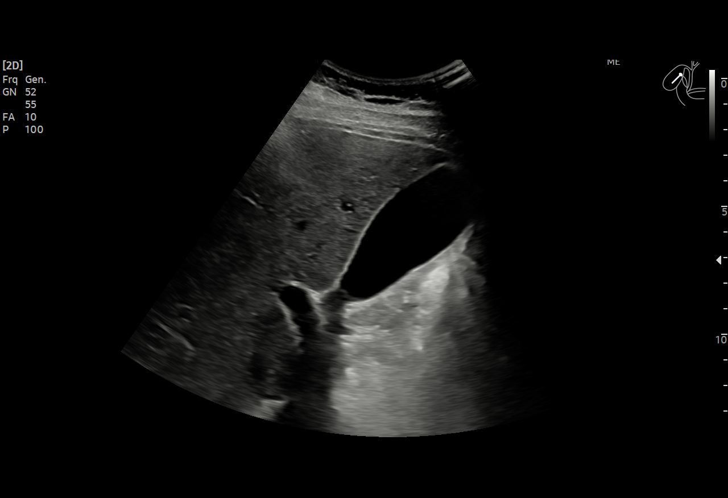
[im 4/40]
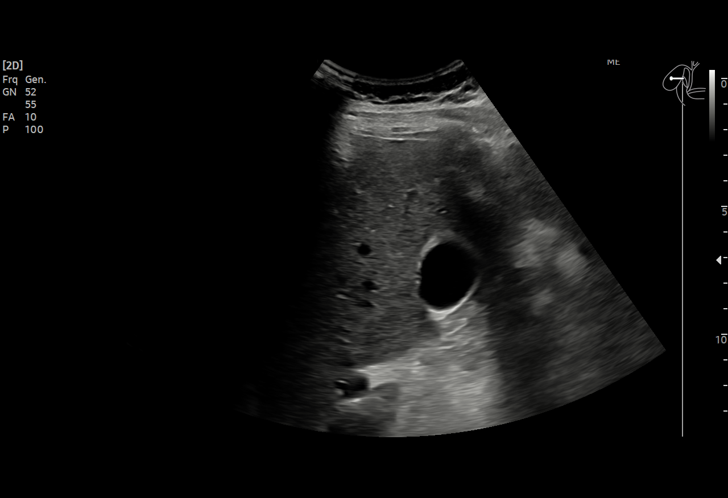
[im 7/40]
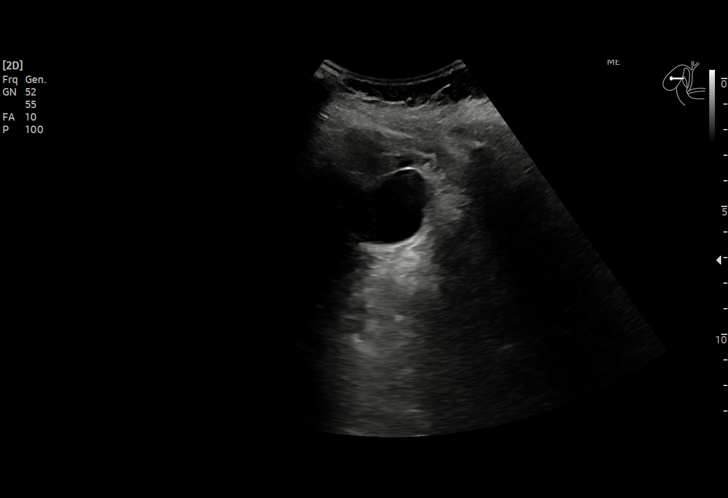
[im 9/40]
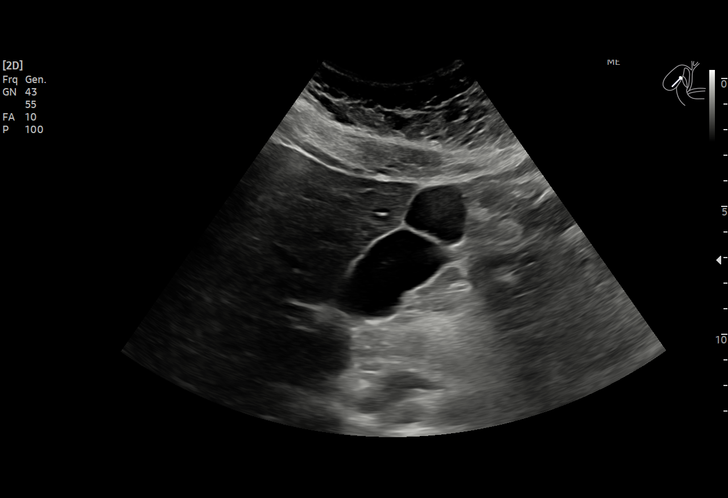
[im 12/40]
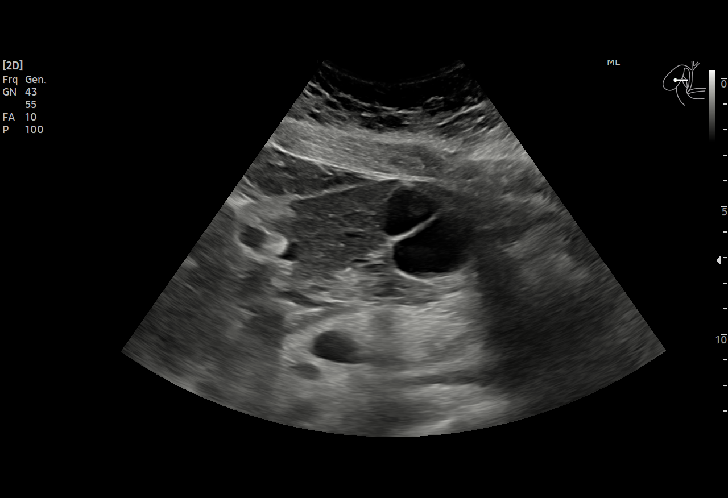
[im 15/40]
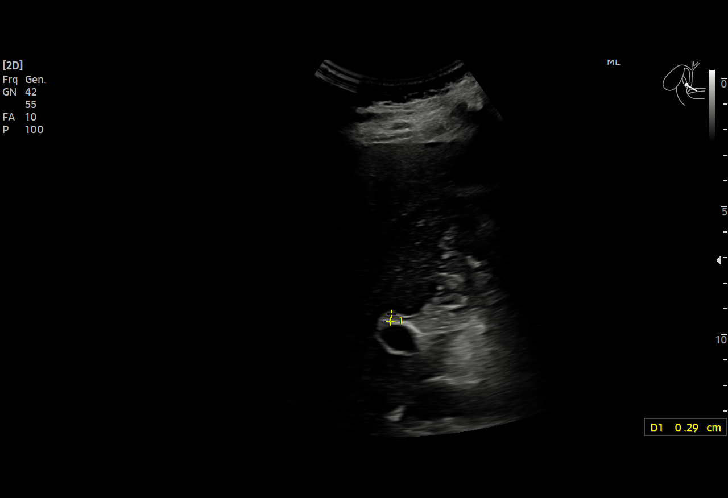
[im 17/40]
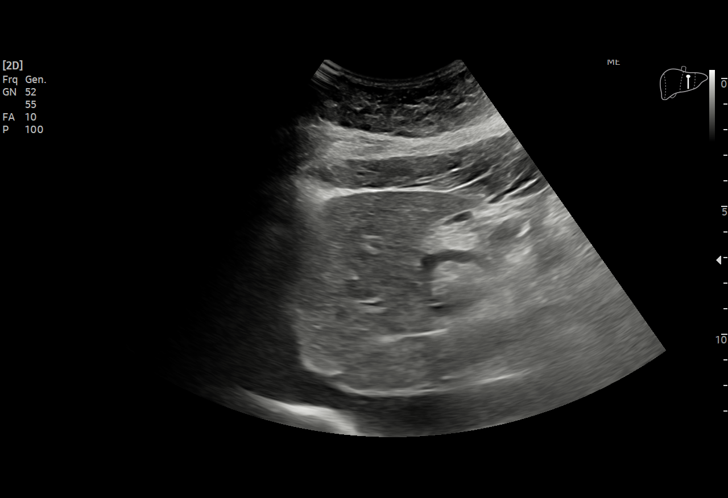
[im 20/40]
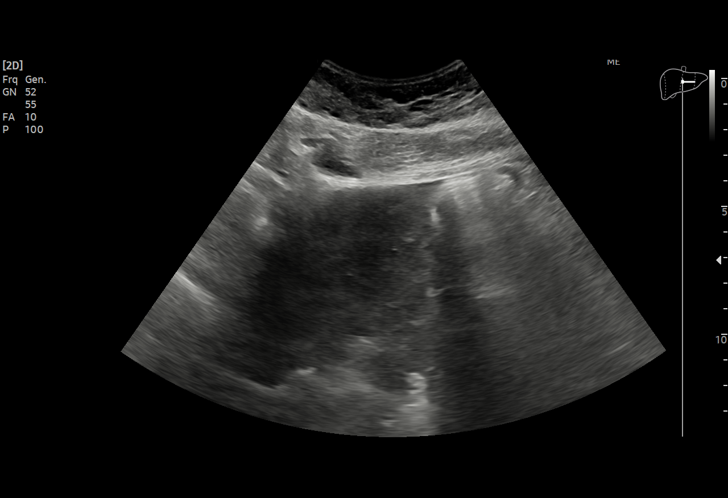
[im 23/40]
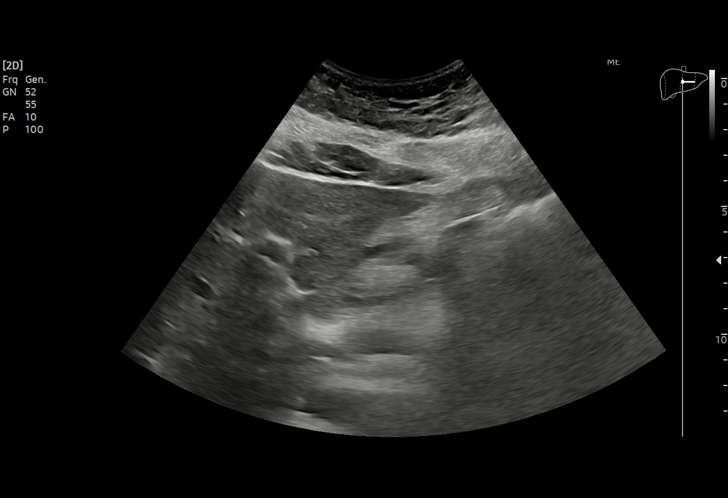
[im 25/40]
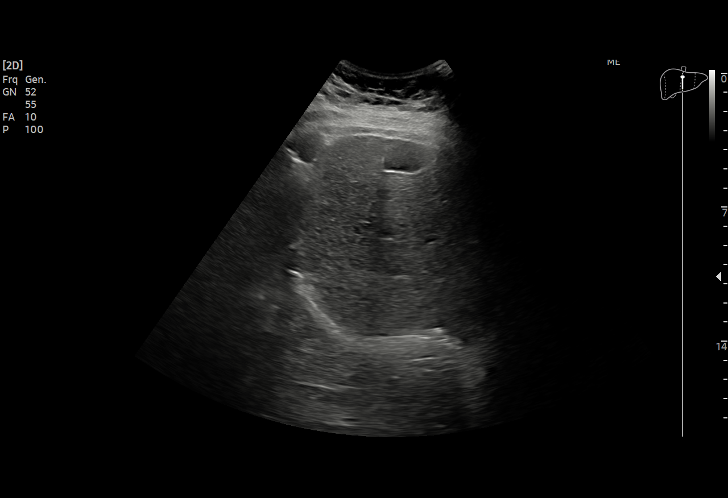
[im 28/40]
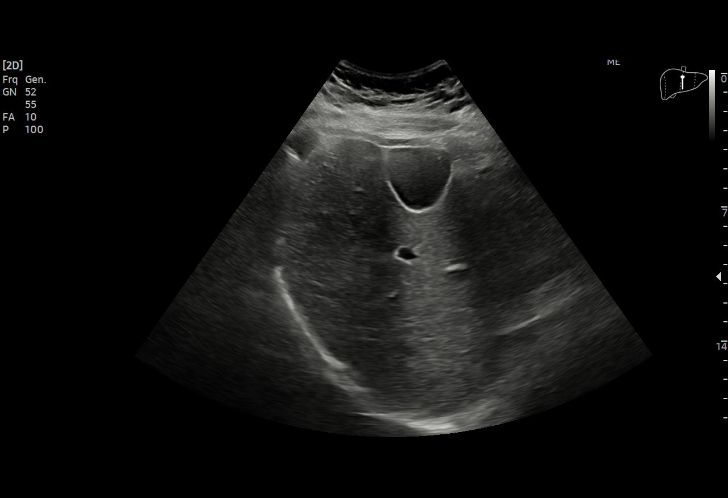
[im 31/40]
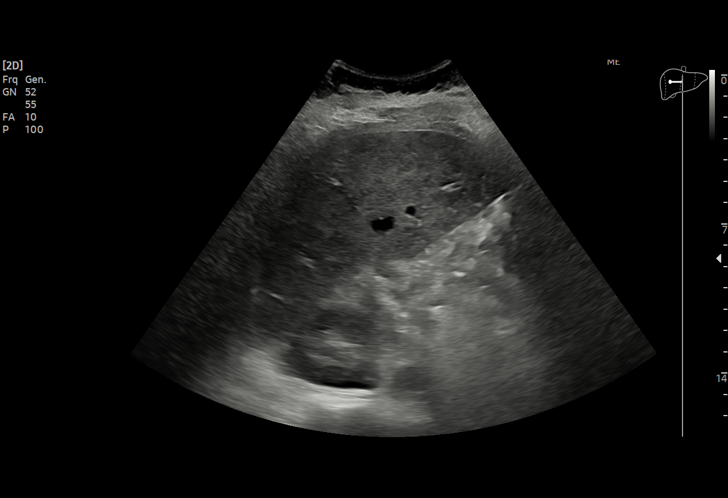
[im 33/40]
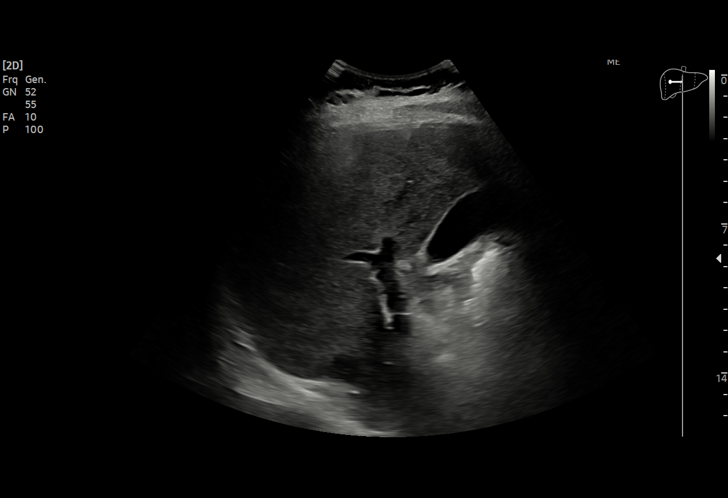
[im 36/40]
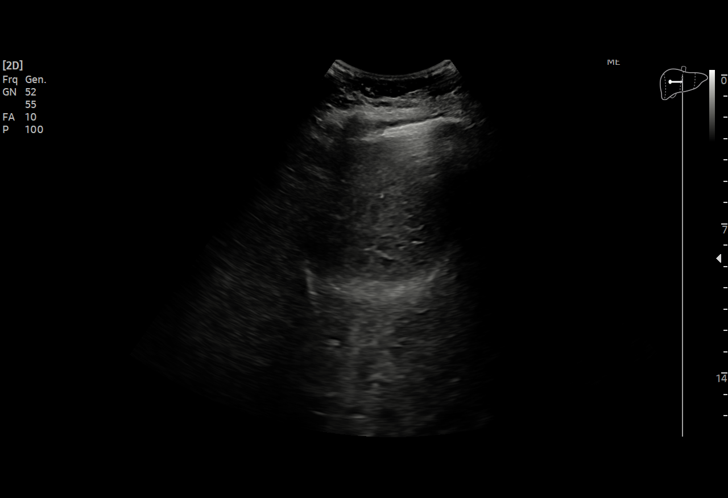
[im 40/40]
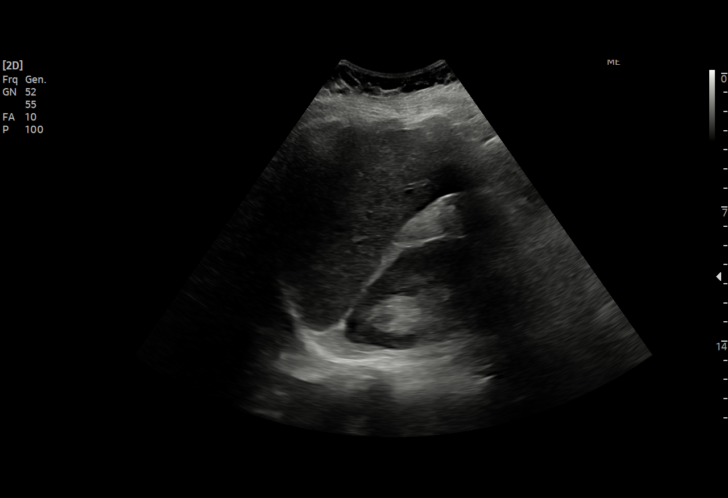

[15 of 25 positions shown; findings below may reference images not displayed]

FINDINGS: Gallbladder:

No gallstones or wall thickening visualized. No sonographic Murphy
sign noted by sonographer.

Common bile duct:

Diameter: 3 mm

Liver:

No focal lesion identified. Within normal limits in parenchymal
echogenicity. Portal vein is patent on color Doppler imaging with
normal direction of blood flow towards the liver.

Other: None.
IMPRESSION: No significant sonographic abnormality of the liver or gallbladder.

## 2022-05-11 ENCOUNTER — Encounter: Payer: Self-pay | Admitting: Family Medicine

## 2022-05-11 ENCOUNTER — Ambulatory Visit (INDEPENDENT_AMBULATORY_CARE_PROVIDER_SITE_OTHER): Payer: 59 | Admitting: Family Medicine

## 2022-05-11 VITALS — BP 118/62 | Wt 215.0 lb

## 2022-05-11 DIAGNOSIS — O99210 Obesity complicating pregnancy, unspecified trimester: Secondary | ICD-10-CM

## 2022-05-11 DIAGNOSIS — Z3A29 29 weeks gestation of pregnancy: Secondary | ICD-10-CM

## 2022-05-11 DIAGNOSIS — O0990 Supervision of high risk pregnancy, unspecified, unspecified trimester: Secondary | ICD-10-CM

## 2022-05-11 DIAGNOSIS — Z8759 Personal history of other complications of pregnancy, childbirth and the puerperium: Secondary | ICD-10-CM

## 2022-05-11 DIAGNOSIS — O09523 Supervision of elderly multigravida, third trimester: Secondary | ICD-10-CM

## 2022-05-11 LAB — POCT URINALYSIS DIPSTICK OB
Glucose, UA: NEGATIVE
POC,PROTEIN,UA: NEGATIVE

## 2022-05-11 NOTE — Progress Notes (Signed)
   PRENATAL VISIT NOTE  Subjective:  Janet Ruiz is a 36 y.o. G3P1011 at [redacted]w[redacted]d being seen today for ongoing prenatal care.  She is currently monitored for the following issues for this high-risk pregnancy and has Supervision of high risk pregnancy, antepartum; AMA (advanced maternal age) multigravida 18+; History of 3rd/4th degree laceration ; Elevated BMI in pregnancy; and History of pregnancy induced hypertension on their problem list.  Patient reports no complaints.  Contractions: Irritability. Vag. Bleeding: None.  Movement: Present. Denies leaking of fluid.   The following portions of the patient's history were reviewed and updated as appropriate: allergies, current medications, past family history, past medical history, past social history, past surgical history and problem list.   Objective:   Vitals:   05/11/22 0953  BP: 118/62  Weight: 215 lb (97.5 kg)    Fetal Status: Fetal Heart Rate (bpm): 145 Fundal Height: 30 cm Movement: Present     General:  Alert, oriented and cooperative. Patient is in no acute distress.  Skin: Skin is warm and dry. No rash noted.   Cardiovascular: Normal heart rate noted  Respiratory: Normal respiratory effort, no problems with respiration noted  Abdomen: Soft, gravid, appropriate for gestational age.  Pain/Pressure: Absent     Pelvic: Cervical exam deferred        Extremities: Normal range of motion.  Edema: None  Mental Status: Normal mood and affect. Normal behavior. Normal judgment and thought content.   Assessment and Plan:  Pregnancy: G3P1011 at [redacted]w[redacted]d 1. [redacted] weeks gestation of pregnancy - POC Urinalysis Dipstick OB  2. Supervision of high risk pregnancy, antepartum Up to date Reviewed FH growth and reviewed low threshold to get MFM [place] if concerned about fetal growth  3. Multigravida of advanced maternal age in third trimester LR NIP  4. History of pregnancy induced hypertension Was not put on ASA early in pregnancy. Not useful at  this point for prevention.  BP WNL, monitor closely  5. Elevated BMI in pregnancy TWG=3 lb (1.361 kg) which is at goal    Preterm labor symptoms and general obstetric precautions including but not limited to vaginal bleeding, contractions, leaking of fluid and fetal movement were reviewed in detail with the patient. Please refer to After Visit Summary for other counseling recommendations.   Return in about 2 weeks (around 05/25/2022) for Routine prenatal care.  Future Appointments  Date Time Provider [place]  05/25/2022  8:35 AM Caren Macadam, MD [facility] None  06/08/2022  8:15 AM Imagene Riches, CNM [facility] None  06/23/2022  8:15 AM Imagene Riches, CNM [facility] None    Caren Macadam, MD

## 2022-05-25 ENCOUNTER — Ambulatory Visit (INDEPENDENT_AMBULATORY_CARE_PROVIDER_SITE_OTHER): Payer: 59 | Admitting: Family Medicine

## 2022-05-25 DIAGNOSIS — I863 Vulval varices: Secondary | ICD-10-CM

## 2022-05-25 DIAGNOSIS — O99210 Obesity complicating pregnancy, unspecified trimester: Secondary | ICD-10-CM

## 2022-05-25 DIAGNOSIS — Z8759 Personal history of other complications of pregnancy, childbirth and the puerperium: Secondary | ICD-10-CM

## 2022-05-25 DIAGNOSIS — O0990 Supervision of high risk pregnancy, unspecified, unspecified trimester: Secondary | ICD-10-CM

## 2022-05-25 LAB — POCT URINALYSIS DIPSTICK OB
Glucose, UA: NEGATIVE
POC,PROTEIN,UA: NEGATIVE

## 2022-05-25 NOTE — Progress Notes (Signed)
ROB- possibly start of a bartholins cyst, feels like a bubble, not painful

## 2022-05-25 NOTE — Patient Instructions (Signed)
Safe Medications in Pregnancy    Colds/Coughs/Allergies: Benadryl (alcohol free) 25 mg every 6 hours as needed Breath right strips Claritin Cepacol throat lozenges Chloraseptic throat spray Cold-Eeze- up to three times per day Cough drops, alcohol free Flonase (by prescription only) Guaifenesin Mucinex Delsym Robitussin DM (plain only, alcohol free) Saline nasal spray/drops Sudafed (pseudoephedrine) & Actifed ** use only after [redacted] weeks gestation and if you do not have high blood pressure Tylenol Vicks Vaporub Zinc lozenges Zyrtec

## 2022-05-25 NOTE — Progress Notes (Signed)
   PRENATAL VISIT NOTE  Subjective:  Janet Ruiz is a 36 y.o. G3P1011 at [redacted]w[redacted]d being seen today for ongoing prenatal care.  She is currently monitored for the following issues for this high-risk pregnancy and has Supervision of high risk pregnancy, antepartum; AMA (advanced maternal age) multigravida 36+; History of 3rd/4th degree laceration ; Elevated BMI in pregnancy; History of pregnancy induced hypertension; and Vulvar varicose veins on their problem list.  Patient reports  cold sx, COVID neg .  Contractions: Irritability.  .  Movement: Present. Denies leaking of fluid.   The following portions of the patient's history were reviewed and updated as appropriate: allergies, current medications, past family history, past medical history, past social history, past surgical history and problem list.   Objective:   Vitals:   05/25/22 0831  BP: 110/70  Weight: 216 lb (98 kg)    Fetal Status: Fetal Heart Rate (bpm): 152 Fundal Height: 31 cm Movement: Present  Presentation: Vertex  General:  Alert, oriented and cooperative. Patient is in no acute distress.  Skin: Skin is warm and dry. No rash noted.   Cardiovascular: Normal heart rate noted  Respiratory: Normal respiratory effort, no problems with respiration noted  Abdomen: Soft, gravid, appropriate for gestational age.  Pain/Pressure: Absent     Pelvic: Genital exam with chaperone present-- + right vulvar varicosities         Extremities: Normal range of motion.  Edema: None  Mental Status: Normal mood and affect. Normal behavior. Normal judgment and thought content.   Assessment and Plan:  Pregnancy: G3P1011 at [redacted]w[redacted]d  1. Vulvar varicose veins Discussed possible vulvar compression if become painful  2. Obstetric vaginal laceration with third degree perineal laceration, unspecified subtype Keep in mind at delivery  3. Supervision of high risk pregnancy, antepartum UTD growth is appropriate Reviewed pregnancy safe meds for  cold/cough  4. History of pregnancy induced hypertension BP WNL today  5. Elevated BMI in pregnancy TWG=4 lb (1.814 kg)    Preterm labor symptoms and general obstetric precautions including but not limited to vaginal bleeding, contractions, leaking of fluid and fetal movement were reviewed in detail with the patient. Please refer to After Visit Summary for other counseling recommendations.   Return in about 2 weeks (around 06/08/2022).  Future Appointments  Date Time Provider [place]  06/07/2022  8:35 AM Caren Macadam, MD [facility] None  06/21/2022  8:15 AM Rod Can, CNM [facility] None  07/06/2022  8:15 AM Imagene Riches, CNM [facility] None    Caren Macadam, MD

## 2022-05-25 NOTE — Addendum Note (Signed)
Addended by: Donnetta Hail on: 05/25/2022 09:17 AM   Modules accepted: Orders

## 2022-05-27 ENCOUNTER — Encounter: Payer: Self-pay | Admitting: Family Medicine

## 2022-05-27 NOTE — Telephone Encounter (Signed)
Pt calling; was seen by [facility] Tues; cold is worse - cough, sore throat; was told can take Delsym but it doesn't seem to be working.  [phone]  Pt states she has sent a MyChart msg to [facility]; she has also gone to [facility] and dx'd with sinusitis; was rx'd amoxicillin.  Adv amoxicillin is okay as long as she is not allergies to it; can change to plain robitussin or plain musinex, Hall's cough drops, gargle with warm salt water, saline nasal spray. To let us know if sxs worsen.

## 2022-06-07 ENCOUNTER — Ambulatory Visit (INDEPENDENT_AMBULATORY_CARE_PROVIDER_SITE_OTHER): Payer: 59 | Admitting: Family Medicine

## 2022-06-07 VITALS — BP 120/62 | Wt 218.0 lb

## 2022-06-07 DIAGNOSIS — Z8759 Personal history of other complications of pregnancy, childbirth and the puerperium: Secondary | ICD-10-CM

## 2022-06-07 DIAGNOSIS — Z3A32 32 weeks gestation of pregnancy: Secondary | ICD-10-CM

## 2022-06-07 DIAGNOSIS — I863 Vulval varices: Secondary | ICD-10-CM

## 2022-06-07 DIAGNOSIS — O0990 Supervision of high risk pregnancy, unspecified, unspecified trimester: Secondary | ICD-10-CM

## 2022-06-07 DIAGNOSIS — O09523 Supervision of elderly multigravida, third trimester: Secondary | ICD-10-CM

## 2022-06-07 LAB — POCT URINALYSIS DIPSTICK OB
Glucose, UA: NEGATIVE
POC,PROTEIN,UA: NEGATIVE

## 2022-06-07 NOTE — Progress Notes (Signed)
   PRENATAL VISIT NOTE  Subjective:  Janet Ruiz is a 36 y.o. G3P1011 at [redacted]w[redacted]d being seen today for ongoing prenatal care.  She is currently monitored for the following issues for this high-risk pregnancy and has Supervision of high risk pregnancy, antepartum; AMA (advanced maternal age) multigravida 35+; History of 3rd/4th degree laceration ; Elevated BMI in pregnancy; History of pregnancy induced hypertension; and Vulvar varicose veins on their problem list.  Patient reports  reports heat rash - noted after being at the pool with children .  Contractions: Not present. Vag. Bleeding: None.  Movement: Present. Denies leaking of fluid.   The following portions of the patient's history were reviewed and updated as appropriate: allergies, current medications, past family history, past medical history, past social history, past surgical history and problem list.   Objective:   Vitals:   06/07/22 0830  BP: 120/62  Weight: 218 lb (98.9 kg)    Fetal Status: Fetal Heart Rate (bpm): 145 Fundal Height: 33 cm Movement: Present  Presentation: Vertex  General:  Alert, oriented and cooperative. Patient is in no acute distress.  Skin: Skin is warm and dry. No rash noted.   Cardiovascular: Normal heart rate noted  Respiratory: Normal respiratory effort, no problems with respiration noted  Abdomen: Soft, gravid, appropriate for gestational age.  Pain/Pressure: Absent     Pelvic: Cervical exam deferred        Extremities: Normal range of motion.  Edema: None  Mental Status: Normal mood and affect. Normal behavior. Normal judgment and thought content.   Assessment and Plan:  Pregnancy: G3P1011 at [redacted]w[redacted]d  1. History of pregnancy induced hypertension BP is wnl today  2. Supervision of high risk pregnancy, antepartum Up to date Desires BTS. Discussed LARC options today as well as vasectomy. Partner had testicular cancer and they are going to look into vasectomy options as well. She would like an  immediate pp tubal or tubal with CS if she needed one.   3. Obstetric vaginal laceration with third degree perineal laceration, unspecified subtype  4. Multigravida of advanced maternal age in third trimester  5. Vulvar varicose veins stable  Preterm labor symptoms and general obstetric precautions including but not limited to vaginal bleeding, contractions, leaking of fluid and fetal movement were reviewed in detail with the patient. Please refer to After Visit Summary for other counseling recommendations.   Return in about 2 weeks (around 06/21/2022) for Routine prenatal care.  Future Appointments  Date Time Provider Department Center  06/21/2022  8:15 AM Tresea Mall, CNM [facility] None  06/28/2022  9:35 AM Alvester Morin, Isa Rankin, MD [facility] None    Federico Flake, MD

## 2022-06-08 ENCOUNTER — Encounter: Payer: 59 | Admitting: Obstetrics

## 2022-06-09 ENCOUNTER — Encounter: Payer: 59 | Admitting: Advanced Practice Midwife

## 2022-06-21 ENCOUNTER — Ambulatory Visit (INDEPENDENT_AMBULATORY_CARE_PROVIDER_SITE_OTHER): Payer: 59 | Admitting: Advanced Practice Midwife

## 2022-06-21 ENCOUNTER — Encounter: Payer: Self-pay | Admitting: Advanced Practice Midwife

## 2022-06-21 VITALS — BP 118/74 | Wt 222.0 lb

## 2022-06-21 DIAGNOSIS — O09523 Supervision of elderly multigravida, third trimester: Secondary | ICD-10-CM

## 2022-06-21 DIAGNOSIS — O0993 Supervision of high risk pregnancy, unspecified, third trimester: Secondary | ICD-10-CM

## 2022-06-21 DIAGNOSIS — Z3A34 34 weeks gestation of pregnancy: Secondary | ICD-10-CM

## 2022-06-21 LAB — POCT URINALYSIS DIPSTICK OB
Glucose, UA: NEGATIVE
POC,PROTEIN,UA: NEGATIVE

## 2022-06-21 NOTE — Progress Notes (Signed)
Routine Prenatal Care Visit  Subjective  Janet Ruiz is a 36 y.o. G3P1011 at [redacted]w[redacted]d being seen today for ongoing prenatal care.  She is currently monitored for the following issues for this high-risk pregnancy and has Supervision of high risk pregnancy, antepartum; AMA (advanced maternal age) multigravida 35+; History of 3rd/4th degree laceration ; Elevated BMI in pregnancy; History of pregnancy induced hypertension; and Vulvar varicose veins on their problem list.  ----------------------------------------------------------------------------------- Patient reports no complaints.   Contractions: Not present. Vag. Bleeding: None.  Movement: Present. Leaking Fluid denies.  ----------------------------------------------------------------------------------- The following portions of the patient's history were reviewed and updated as appropriate: allergies, current medications, past family history, past medical history, past social history, past surgical history and problem list. Problem list updated.  Objective  Blood pressure 118/74, weight 222 lb (100.7 kg), last menstrual period 10/20/2021. Pregravid weight 212 lb (96.2 kg) Total Weight Gain 10 lb (4.536 kg) Urinalysis: Urine Protein Negative  Urine Glucose Negative  Fetal Status: Fetal Heart Rate (bpm): 147 Fundal Height: 35 cm Movement: Present     General:  Alert, oriented and cooperative. Patient is in no acute distress.  Skin: Skin is warm and dry. No rash noted.   Cardiovascular: Normal heart rate noted  Respiratory: Normal respiratory effort, no problems with respiration noted  Abdomen: Soft, gravid, appropriate for gestational age. Pain/Pressure: Absent     Pelvic:  Cervical exam deferred        Extremities: Normal range of motion.  Edema: None  Mental Status: Normal mood and affect. Normal behavior. Normal judgment and thought content.   Assessment   36 y.o. G3P1011 at [redacted]w[redacted]d by  07/27/2022, by Last Menstrual Period presenting for  routine prenatal visit  Plan   prgenancy 3 Problems (from 12/15/21 to present)    Problem Noted Resolved   AMA (advanced maternal age) multigravida 35+ 04/27/2022 by Federico Flake, MD No   History of 3rd/4th degree laceration  04/27/2022 by Federico Flake, MD No   History of pregnancy induced hypertension 04/27/2022 by Federico Flake, MD No   Overview Signed 04/27/2022 11:02 AM by Federico Flake, MD    Induced at [redacted]w[redacted]d for elevated BP      Supervision of high risk pregnancy, antepartum 12/15/2021 by Ellwood Sayers, CNM No   Overview Addendum 06/07/2022  9:01 AM by Federico Flake, MD     Nursing Staff Provider  Office Location  [facility] Dating  [redacted]w[redacted]d 12/29/21  Language  English Anatomy US  Normal female  Flu Vaccine  2022 Genetic Screen  NIPS: neg, XX  TDaP vaccine   04/27/22  Hgb A1C or  GTT Early :5.6Third trimester : 128  Covid 03/2020    LAB RESULTS   Rhogam  NA Blood Type B/Positive/-- (12/27 1041)   Feeding Plan breast Antibody Negative (12/27 1041)  Contraception BTL-- immediate pp or with CS.  Rubella 1.19 (12/27 1041)  Circumcision  RPR Non Reactive (05/09 1023)   Pediatrician  [facility] HBsAg Negative (12/27 1041)   Support Person  HIV Non Reactive (05/09 1023)  Prenatal Classes  Varicella immune    GBS  (For PCN allergy, check sensitivities)   BTL Consent     VBAC Consent  Pap  neg 2022    Hgb Electro    Pelvis Tested  CF      SMA                   Preterm labor symptoms and general  obstetric precautions including but not limited to vaginal bleeding, contractions, leaking of fluid and fetal movement were reviewed in detail with the patient. Please refer to After Visit Summary for other counseling recommendations.   Return in about 1 week (around 06/28/2022) for rob.  Tresea Mall, CNM 06/21/2022 8:37 AM

## 2022-06-21 NOTE — Progress Notes (Signed)
No vb. No lof.  

## 2022-06-23 ENCOUNTER — Encounter: Payer: 59 | Admitting: Obstetrics

## 2022-06-28 ENCOUNTER — Ambulatory Visit (INDEPENDENT_AMBULATORY_CARE_PROVIDER_SITE_OTHER): Payer: 59 | Admitting: Obstetrics & Gynecology

## 2022-06-28 ENCOUNTER — Other Ambulatory Visit (HOSPITAL_COMMUNITY)
Admission: RE | Admit: 2022-06-28 | Discharge: 2022-06-28 | Disposition: A | Discharge status: Home / Self Care | Payer: 59 | Source: Ambulatory Visit | Attending: Obstetrics | Admitting: Obstetrics

## 2022-06-28 ENCOUNTER — Encounter: Payer: 59 | Admitting: Family Medicine

## 2022-06-28 VITALS — BP 110/60 | Wt 223.0 lb

## 2022-06-28 DIAGNOSIS — N898 Other specified noninflammatory disorders of vagina: Secondary | ICD-10-CM

## 2022-06-28 DIAGNOSIS — O26893 Other specified pregnancy related conditions, third trimester: Secondary | ICD-10-CM

## 2022-06-28 DIAGNOSIS — Z3A35 35 weeks gestation of pregnancy: Secondary | ICD-10-CM | POA: Diagnosis present

## 2022-06-28 DIAGNOSIS — O0993 Supervision of high risk pregnancy, unspecified, third trimester: Secondary | ICD-10-CM

## 2022-06-28 DIAGNOSIS — O09523 Supervision of elderly multigravida, third trimester: Secondary | ICD-10-CM

## 2022-06-28 DIAGNOSIS — Z3483 Encounter for supervision of other normal pregnancy, third trimester: Secondary | ICD-10-CM | POA: Insufficient documentation

## 2022-06-28 DIAGNOSIS — Z8759 Personal history of other complications of pregnancy, childbirth and the puerperium: Secondary | ICD-10-CM

## 2022-06-28 DIAGNOSIS — O99210 Obesity complicating pregnancy, unspecified trimester: Secondary | ICD-10-CM

## 2022-06-28 NOTE — Progress Notes (Addendum)
Subjective:    Janet Ruiz is a 36 y.o. married P1 (35 yo son)  being seen today for her obstetrical visit. She is at [redacted]w[redacted]d gestation. Patient reports no complaints except that Tums is not helping her reflux symptoms. She is wanting to know if she can take Pepcid as her pregnant sister is. Fetal movement: normal. Her husband is considering a vasectomy for contraception and she is planning to breastfeed.  Review of Systems:   Review of Systems   Objective:    BP 110/60   Wt 223 lb (101.2 kg)   LMP 10/20/2021   BMI 32.93 kg/m   Physical Exam  Exam  Well nourished, well hydrated White female, no apparent distress She is ambulating and conversing normal Vaginal discharge FHT: 144 BPM  Uterine Size: 36 cm  Presentation: cephalic     Assessment:    Pregnancy:  G3P1011    Plan:    Patient Active Problem List   Diagnosis Date Noted   Vulvar varicose veins 05/25/2022   AMA (advanced maternal age) multigravida 35+ 04/27/2022   History of 3rd/4th degree laceration  04/27/2022   Elevated BMI in pregnancy 04/27/2022   History of pregnancy induced hypertension 04/27/2022   Supervision of high risk pregnancy, antepartum 12/15/2021    Routine care GBS and cervical cultures done today She has all of her OB visits already scheduled.

## 2022-06-29 LAB — CERVICOVAGINAL ANCILLARY ONLY
Chlamydia: NEGATIVE
Comment: NEGATIVE
Comment: NORMAL
Neisseria Gonorrhea: NEGATIVE

## 2022-07-02 LAB — CULTURE, BETA STREP (GROUP B ONLY): Strep Gp B Culture: NEGATIVE

## 2022-07-06 ENCOUNTER — Encounter: Payer: 59 | Admitting: Obstetrics

## 2022-07-06 ENCOUNTER — Ambulatory Visit (INDEPENDENT_AMBULATORY_CARE_PROVIDER_SITE_OTHER): Payer: 59 | Admitting: Obstetrics

## 2022-07-06 VITALS — BP 118/74 | Wt 226.0 lb

## 2022-07-06 DIAGNOSIS — O0990 Supervision of high risk pregnancy, unspecified, unspecified trimester: Secondary | ICD-10-CM

## 2022-07-06 DIAGNOSIS — Z3A37 37 weeks gestation of pregnancy: Secondary | ICD-10-CM

## 2022-07-06 NOTE — Progress Notes (Signed)
Routine Prenatal Care Visit  Subjective  Janet Ruiz is a 36 y.o. G3P1011 at [redacted]w[redacted]d being seen today for ongoing prenatal care.  She is currently monitored for the following issues for this low-risk pregnancy and has Supervision of high risk pregnancy, antepartum; AMA (advanced maternal age) multigravida 35+; History of 3rd/4th degree laceration ; Elevated BMI in pregnancy; History of pregnancy induced hypertension; and Vulvar varicose veins on their problem list.  ----------------------------------------------------------------------------------- Patient reports no complaints.   Contractions: Irregular. Vag. Bleeding: None.  Movement: Present. Leaking Fluid denies.  ----------------------------------------------------------------------------------- The following portions of the patient's history were reviewed and updated as appropriate: allergies, current medications, past family history, past medical history, past social history, past surgical history and problem list. Problem list updated.  Objective  Blood pressure 118/74, weight 226 lb (102.5 kg), last menstrual period 10/20/2021. Pregravid weight 212 lb (96.2 kg) Total Weight Gain 14 lb (6.35 kg) Urinalysis: Urine Protein    Urine Glucose    Fetal Status:     Movement: Present     General:  Alert, oriented and cooperative. Patient is in no acute distress.  Skin: Skin is warm and dry. No rash noted.   Cardiovascular: Normal heart rate noted  Respiratory: Normal respiratory effort, no problems with respiration noted  Abdomen: Soft, gravid, appropriate for gestational age. Pain/Pressure: Absent     Pelvic:  Cervical exam deferred        Extremities: Normal range of motion.  Edema: Trace  Mental Status: Normal mood and affect. Normal behavior. Normal judgment and thought content.   Assessment   36 y.o. G3P1011 at [redacted]w[redacted]d by  07/27/2022, by Last Menstrual Period presenting for routine prenatal visit  Plan   prgenancy 3 Problems (from  12/15/21 to present)    Problem Noted Resolved   AMA (advanced maternal age) multigravida 35+ 04/27/2022 by Federico Flake, MD No   History of 3rd/4th degree laceration  04/27/2022 by Federico Flake, MD No   History of pregnancy induced hypertension 04/27/2022 by Federico Flake, MD No   Overview Signed 04/27/2022 11:02 AM by Federico Flake, MD    Induced at [redacted]w[redacted]d for elevated BP      Supervision of high risk pregnancy, antepartum 12/15/2021 by Ellwood Sayers, CNM No   Overview Addendum 07/06/2022  8:20 AM by Mirna Mires, CNM     Nursing Staff Provider  Office Location  [facility] Dating  [redacted]w[redacted]d 12/29/21  Language  English Anatomy US  Normal female  Flu Vaccine  2022 Genetic Screen  NIPS: neg, XX  TDaP vaccine   04/27/22  Hgb A1C or  GTT Early :5.6Third trimester : 128  Covid 03/2020    LAB RESULTS   Rhogam  NA Blood Type B/Positive/-- (12/27 1041)   Feeding Plan breast Antibody Negative (12/27 1041)  Contraception BTL-- immediate pp or with CS.  Rubella 1.19 (12/27 1041)  Circumcision  RPR Non Reactive (05/09 1023)   Pediatrician  [facility] HBsAg Negative (12/27 1041)   Support Person  HIV Non Reactive (05/09 1023)  Prenatal Classes  Varicella immune    GBS negative  BTL Consent     VBAC Consent  Pap  neg 2022    Hgb Electro    Pelvis Tested  CF      SMA                  Discussed her GBS, negative results.  Mirna Mires, CNM  07/06/2022 8:21 AM  Return in about 1 week (around 07/13/2022) for return OB.  Mirna Mires, CNM  07/06/2022 8:20 AM

## 2022-07-06 NOTE — Progress Notes (Signed)
No vb. No lof.  

## 2022-07-12 ENCOUNTER — Encounter: Payer: 59 | Admitting: Licensed Practical Nurse

## 2022-07-12 IMAGING — US US OB FOLLOW-UP
1 series · 15 of 28 positions shown · non-contrast
Comparison: none

CLINICAL DATA: Incomplete fetal anatomic survey

EXAM:
OBSTETRIC 14+ WK ULTRASOUND FOLLOW-UP

[Series 1: us ob follow up · 41 acquisitions, 15 frames shown]
[im 1/41]
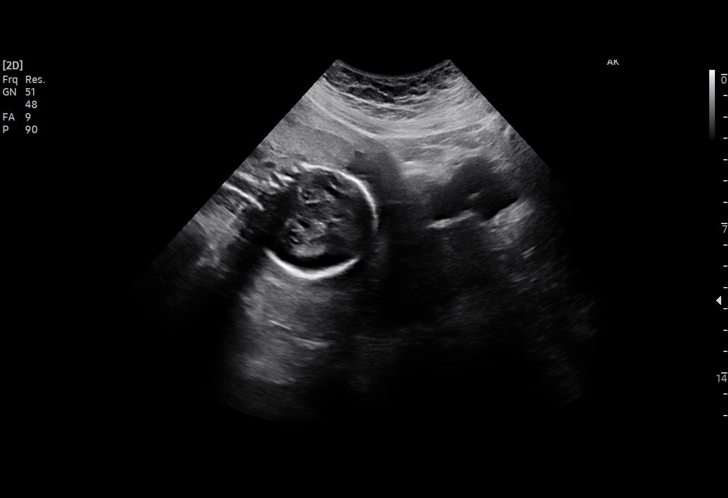
[im 3/41]
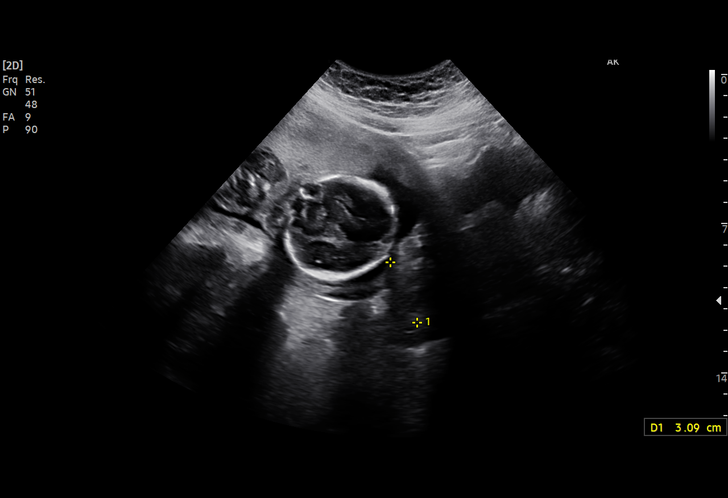
[im 6/41]
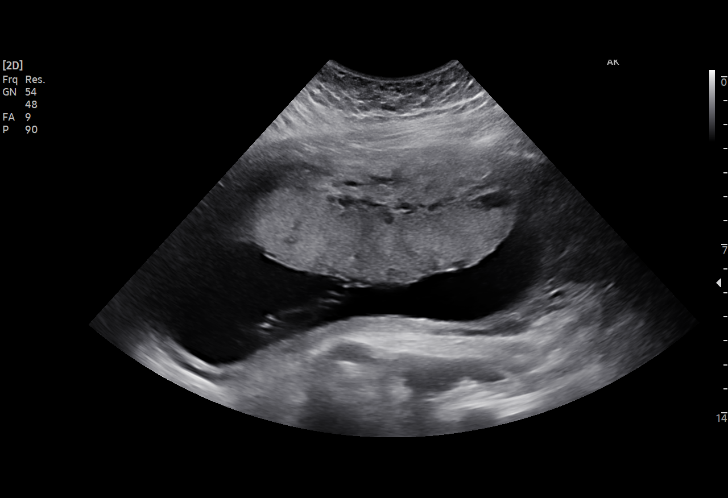
[im 9/41]
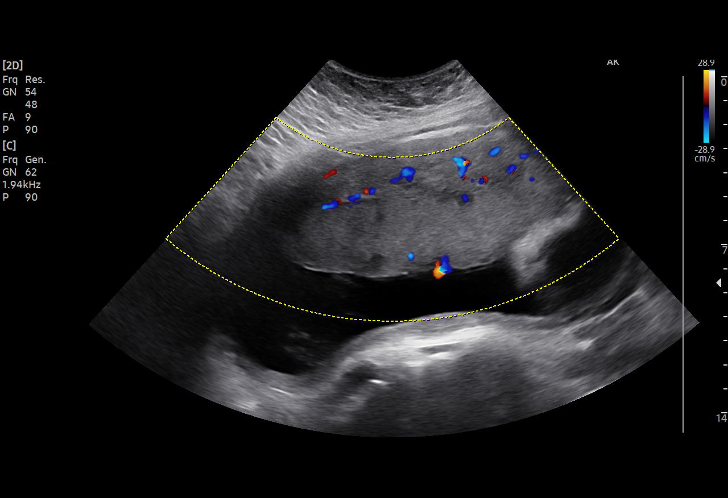
[im 12/41]
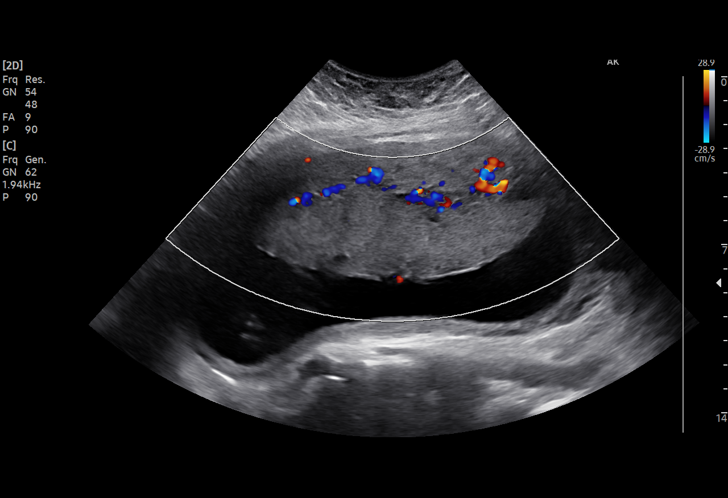
[im 15/41]
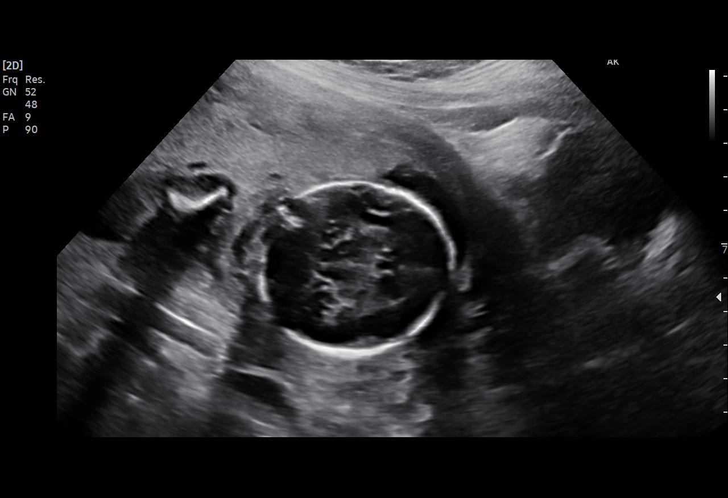
[im 18/41]
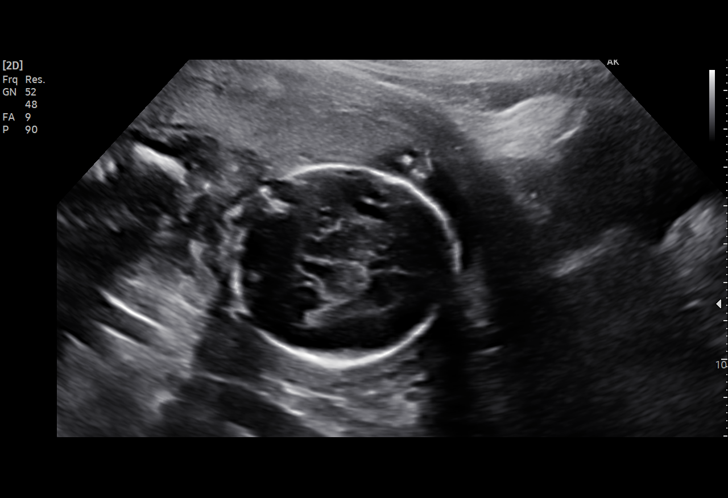
[im 21/41]
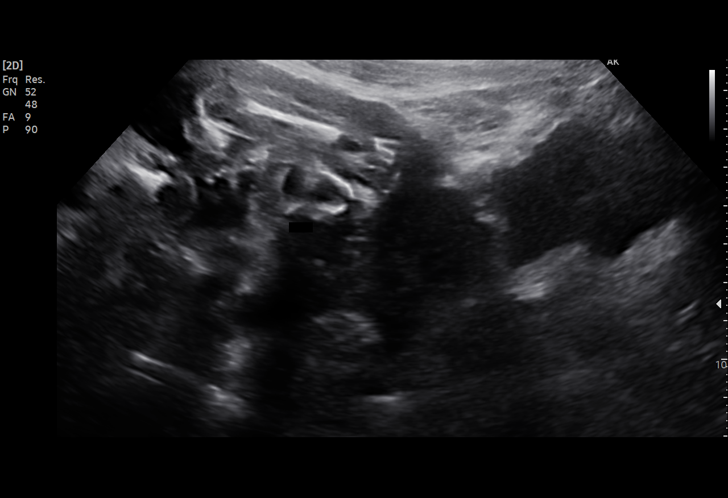
[im 23/41]
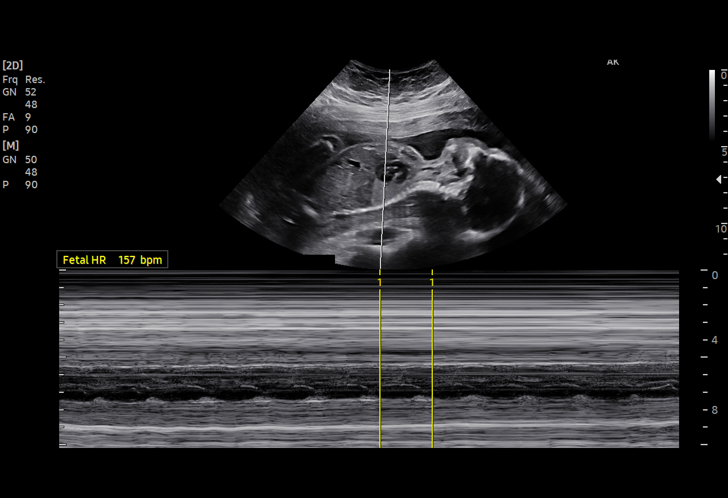
[im 26/41]
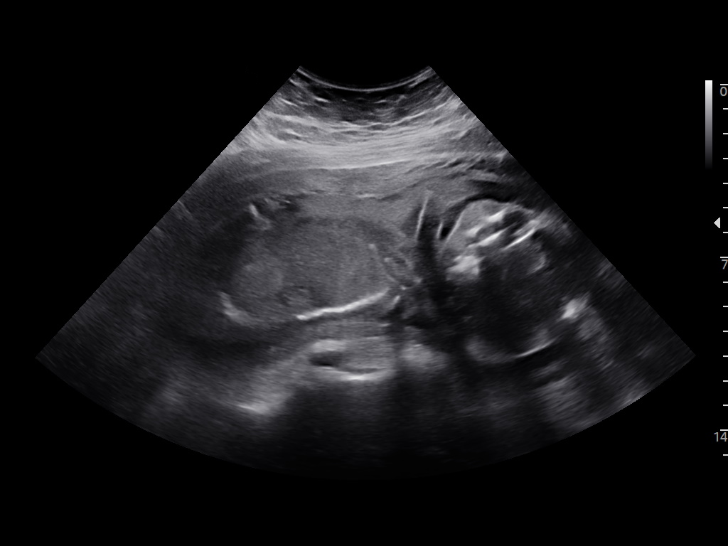
[im 29/41]
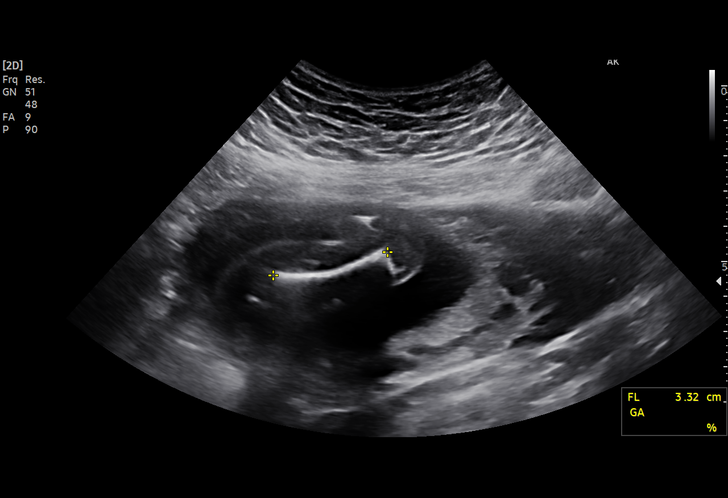
[im 32/41]
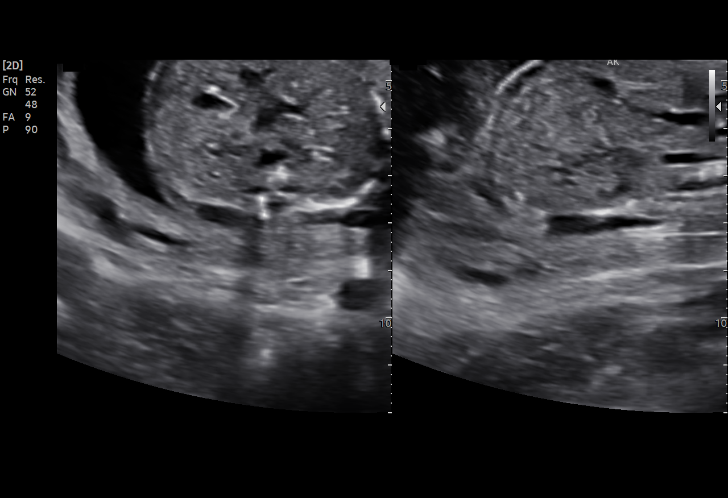
[im 35/41]
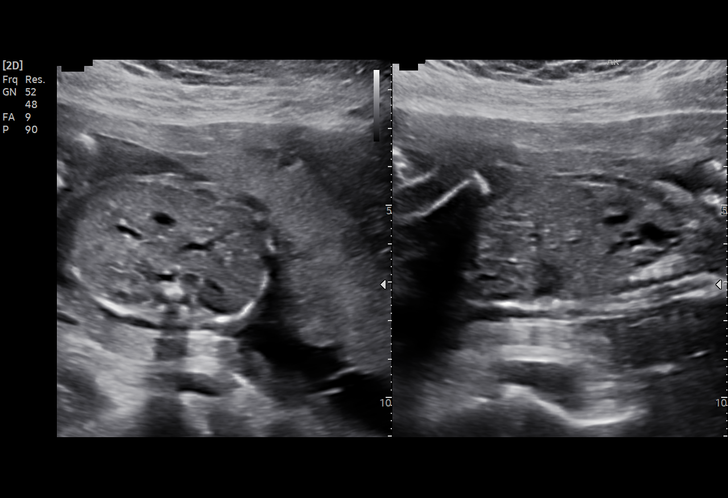
[im 38/41]
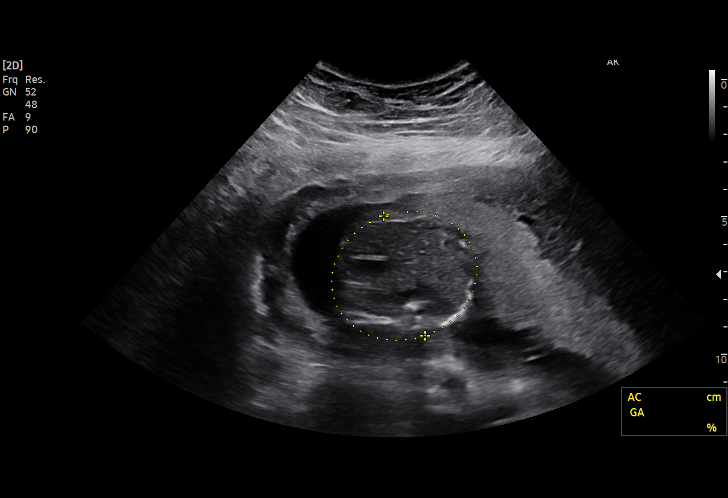
[im 41/41]
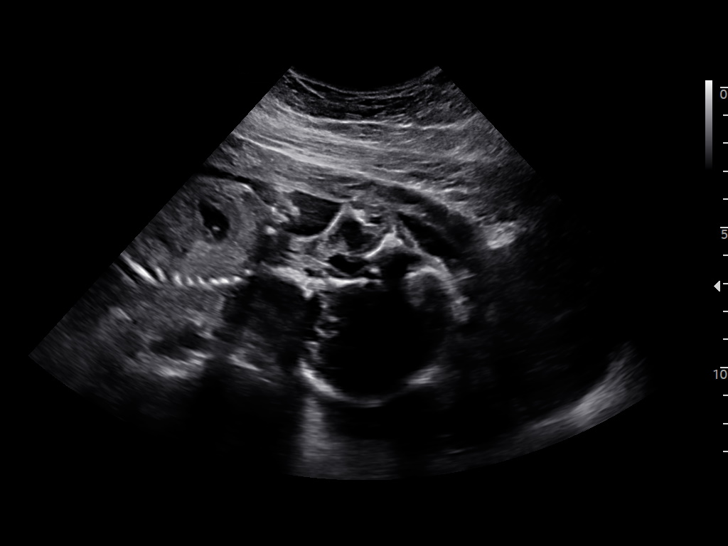

[15 of 28 positions shown; findings below may reference images not displayed]

FINDINGS: Number of Fetuses: 1

Heart Rate:  157 bpm

Movement: Yes

Presentation: Cephalic

Previa: No

Placental Location: Anterior

Amniotic Fluid (Subjective): Normal

Amniotic Fluid (Objective):

Vertical pocket 3.8cm

FETAL BIOMETRY

BPD:  5.1cm 21w 3d

HC:    17.9cm 20w 2d

AC:    14.9cm 20w 1d

FL:    3.4cm 20w 5d

Current Mean GA: 20w 5d US EDC: 07/30/2022

Assigned GA: 21w 1d Assigned EDC: 07/27/2022

FETAL ANATOMY

Lateral Ventricles: Appears normal

Thalami/CSP: Appears normal

Posterior Fossa: Appears normal

Stomach on Left: Appears normal

Kidneys: Appears normal

Bladder: Appears normal

The remaining anatomic structures were evaluated previously.

Technical Limitations: None

Maternal Findings:

Cervix:  3.1 cm
IMPRESSION: 1. Single live intrauterine pregnancy as above, estimated age 20
weeks and 5 days.
2. Unremarkable appearance of the lateral ventricles, thalami, and
posterior fossa, which completes the anatomic survey begun
previously. No fetal anomalies on today's exam.

## 2022-07-13 ENCOUNTER — Ambulatory Visit (INDEPENDENT_AMBULATORY_CARE_PROVIDER_SITE_OTHER): Payer: 59 | Admitting: Family Medicine

## 2022-07-13 VITALS — BP 122/70 | Wt 227.0 lb

## 2022-07-13 DIAGNOSIS — O0990 Supervision of high risk pregnancy, unspecified, unspecified trimester: Secondary | ICD-10-CM

## 2022-07-13 DIAGNOSIS — Z8759 Personal history of other complications of pregnancy, childbirth and the puerperium: Secondary | ICD-10-CM

## 2022-07-13 DIAGNOSIS — O09523 Supervision of elderly multigravida, third trimester: Secondary | ICD-10-CM

## 2022-07-13 DIAGNOSIS — O99210 Obesity complicating pregnancy, unspecified trimester: Secondary | ICD-10-CM

## 2022-07-13 NOTE — Progress Notes (Signed)
Cervical check today. No vb. No lof  

## 2022-07-13 NOTE — Progress Notes (Signed)
    PRENATAL VISIT NOTE  Subjective:  Janet Ruiz is a 36 y.o. G3P1011 at [redacted]w[redacted]d being seen today for ongoing prenatal care.  She is currently monitored for the following issues for this high-risk pregnancy and has Supervision of high risk pregnancy, antepartum; AMA (advanced maternal age) multigravida 35+; History of 3rd/4th degree laceration ; Elevated BMI in pregnancy; History of pregnancy induced hypertension; and Vulvar varicose veins on their problem list.  Patient reports  cramping last night but it went away .  Contractions: Irregular. Vag. Bleeding: None.  Movement: Present. Denies leaking of fluid.   The following portions of the patient's history were reviewed and updated as appropriate: allergies, current medications, past family history, past medical history, past social history, past surgical history and problem list.   Objective:   Vitals:   07/13/22 0849  BP: 122/70  Weight: 227 lb (103 kg)    Fetal Status: Fetal Heart Rate (bpm): 145 Fundal Height: 37 cm Movement: Present  Presentation: Vertex  General:  Alert, oriented and cooperative. Patient is in no acute distress.  Skin: Skin is warm and dry. No rash noted.   Cardiovascular: Normal heart rate noted  Respiratory: Normal respiratory effort, no problems with respiration noted  Abdomen: Soft, gravid, appropriate for gestational age.  Pain/Pressure: Absent     Pelvic: Cervical exam performed in the presence of a chaperone Dilation: 3 Effacement (%): Thick Station: -3  Extremities: Normal range of motion.     Mental Status: Normal mood and affect. Normal behavior. Normal judgment and thought content.   Assessment and Plan:  Pregnancy: G3P1011 at [redacted]w[redacted]d 1. Elevated BMI in pregnancy TWG=15 lb (6.804 kg) which is within goal for pregnancy  2. History of pregnancy induced hypertension BP WNL today  3. Multigravida of advanced maternal age in third trimester NIPS neg  4. Supervision of high risk pregnancy,  antepartum Up to date Had cramping this past week Cervical exam is favorable Discussed IOL at 41 wks if BP remains WNL  Term labor symptoms and general obstetric precautions including but not limited to vaginal bleeding, contractions, leaking of fluid and fetal movement were reviewed in detail with the patient. Please refer to After Visit Summary for other counseling recommendations.   Return in about 1 week (around 07/20/2022) for Routine prenatal care.  Future Appointments  Date Time Provider Department Center  07/22/2022  8:55 AM Mirna Mires, CNM [facility] None  07/26/2022  8:15 AM Allie Bossier, MD [facility] None    Federico Flake, MD

## 2022-07-22 ENCOUNTER — Ambulatory Visit (INDEPENDENT_AMBULATORY_CARE_PROVIDER_SITE_OTHER): Payer: 59 | Admitting: Obstetrics

## 2022-07-22 VITALS — BP 124/74 | Wt 229.0 lb

## 2022-07-22 DIAGNOSIS — O0990 Supervision of high risk pregnancy, unspecified, unspecified trimester: Secondary | ICD-10-CM

## 2022-07-22 DIAGNOSIS — Z3A39 39 weeks gestation of pregnancy: Secondary | ICD-10-CM

## 2022-07-22 NOTE — Progress Notes (Signed)
Cervical check today. No vb. No lof  

## 2022-07-22 NOTE — Progress Notes (Signed)
Routine Prenatal Care Visit  Subjective  Janet Ruiz is a 36 y.o. G3P1011 at [redacted]w[redacted]d being seen today for ongoing prenatal care.  She is currently monitored for the following issues for this high-risk pregnancy and has Supervision of high risk pregnancy, antepartum; AMA (advanced maternal age) multigravida 35+; History of 3rd/4th degree laceration ; Elevated BMI in pregnancy; History of pregnancy induced hypertension; and Vulvar varicose veins on their problem list.  ----------------------------------------------------------------------------------- Patient reports no complaints.  She has BH UCs every evening. Contractions: Irregular. Vag. Bleeding: None.  Movement: Present. Leaking Fluid denies.  ----------------------------------------------------------------------------------- The following portions of the patient's history were reviewed and updated as appropriate: allergies, current medications, past family history, past medical history, past social history, past surgical history and problem list. Problem list updated.  Objective  Blood pressure 124/74, weight 229 lb (103.9 kg), last menstrual period 10/20/2021. Pregravid weight 212 lb (96.2 kg) Total Weight Gain 17 lb (7.711 kg) Urinalysis: Urine Protein    Urine Glucose    Fetal Status:     Movement: Present     General:  Alert, oriented and cooperative. Patient is in no acute distress.  Skin: Skin is warm and dry. No rash noted.   Cardiovascular: Normal heart rate noted  Respiratory: Normal respiratory effort, no problems with respiration noted  Abdomen: Soft, gravid, appropriate for gestational age. Pain/Pressure: Absent     Pelvic:  3.5 cms, very soft, midway, 60%/-3 with forebag. swept        Extremities: Normal range of motion.     Mental Status: Normal mood and affect. Normal behavior. Normal judgment and thought content.   Assessment   36 y.o. G3P1011 at [redacted]w[redacted]d by  07/27/2022, by Last Menstrual Period presenting for routine  prenatal visit  Plan   prgenancy 3 Problems (from 12/15/21 to present)    Problem Noted Resolved   AMA (advanced maternal age) multigravida 35+ 04/27/2022 by Federico Flake, MD No   History of 3rd/4th degree laceration  04/27/2022 by Federico Flake, MD No   History of pregnancy induced hypertension 04/27/2022 by Federico Flake, MD No   Overview Signed 04/27/2022 11:02 AM by Federico Flake, MD    Induced at [redacted]w[redacted]d for elevated BP      Supervision of high risk pregnancy, antepartum 12/15/2021 by Ellwood Sayers, CNM No   Overview Addendum 07/06/2022  8:20 AM by Mirna Mires, CNM     Nursing Staff Provider  Office Location  [facility] Dating  [redacted]w[redacted]d 12/29/21  Language  English Anatomy US  Normal female  Flu Vaccine  2022 Genetic Screen  NIPS: neg, XX  TDaP vaccine   04/27/22  Hgb A1C or  GTT Early :5.6Third trimester : 128  Covid 03/2020    LAB RESULTS   Rhogam  NA Blood Type B/Positive/-- (12/27 1041)   Feeding Plan breast Antibody Negative (12/27 1041)  Contraception BTL-- immediate pp or with CS.  Rubella 1.19 (12/27 1041)  Circumcision  RPR Non Reactive (05/09 1023)   Pediatrician  [facility] HBsAg Negative (12/27 1041)   Support Person  HIV Non Reactive (05/09 1023)  Prenatal Classes  Varicella immune    GBS negative  BTL Consent     VBAC Consent  Pap  neg 2022    Hgb Electro    Pelvis Tested  CF      SMA                   Term labor symptoms and  general obstetric precautions including but not limited to vaginal bleeding, contractions, leaking of fluid and fetal movement were reviewed in detail with the patient. Please refer to After Visit Summary for other counseling recommendations.   Return in about 1 week (around 07/29/2022) for return OB, discuss IOL.  Mirna Mires, CNM  07/22/2022 9:06 AM

## 2022-07-24 ENCOUNTER — Encounter: Payer: Self-pay | Admitting: Obstetrics and Gynecology

## 2022-07-24 ENCOUNTER — Observation Stay
Admission: EM | Admit: 2022-07-24 | Discharge: 2022-07-24 | Disposition: A | Discharge status: Home / Self Care | Payer: 59 | Attending: Obstetrics and Gynecology | Admitting: Obstetrics and Gynecology

## 2022-07-24 DIAGNOSIS — O09523 Supervision of elderly multigravida, third trimester: Secondary | ICD-10-CM | POA: Diagnosis not present

## 2022-07-24 DIAGNOSIS — O26893 Other specified pregnancy related conditions, third trimester: Secondary | ICD-10-CM | POA: Diagnosis not present

## 2022-07-24 DIAGNOSIS — O479 False labor, unspecified: Secondary | ICD-10-CM | POA: Diagnosis present

## 2022-07-24 DIAGNOSIS — Z3A39 39 weeks gestation of pregnancy: Secondary | ICD-10-CM | POA: Diagnosis not present

## 2022-07-24 DIAGNOSIS — O0990 Supervision of high risk pregnancy, unspecified, unspecified trimester: Secondary | ICD-10-CM

## 2022-07-24 DIAGNOSIS — O471 False labor at or after 37 completed weeks of gestation: Secondary | ICD-10-CM | POA: Diagnosis present

## 2022-07-24 DIAGNOSIS — R109 Unspecified abdominal pain: Secondary | ICD-10-CM

## 2022-07-24 DIAGNOSIS — Z8759 Personal history of other complications of pregnancy, childbirth and the puerperium: Secondary | ICD-10-CM

## 2022-07-24 NOTE — OB Triage Note (Signed)
Pt is a 36yo G3P1, 39w 4d. She arrived to the unit with complaints of irregular ctx starting at midnight, rating the ctx a 5/10. She denies vaginal bleeding and reports positive fetal movement. VS stable, monitors applied and assessing.   Initial FHT 135 at 0157.

## 2022-07-24 NOTE — Discharge Summary (Signed)
Physician Final Progress Note  Patient ID: Janet Ruiz MRN: [id number] DOB/AGE: November 17, 1986 36 y.o.  Admit date: 07/24/2022 Admitting provider: Boykin Nearing, MD Discharge date: 07/25/2022   Admission Diagnoses:  1) intrauterine pregnancy at [redacted]w[redacted]d 2) uterine contractions   Discharge Diagnoses:  Principal Problem:   Uterine contractions  Not in labor   History of Present Illness: The patient is a 36y.o. female G3P1011 at 36w4dho presents for uterine contractions. She has been having contractions every night over the last few nights. Tonight the contractions got a little more intense but "are not that bad" but are coming every 4-5 minutes.  Endorses +FM denies LOF/VB. Denies recent IC. Was seen in the office 8/3, had her membranes swept and has had cramping since. There was no change in her cervical exam while in triage.   Past Medical History:  Diagnosis Date   No known health problems     Past Surgical History:  Procedure Laterality Date   DILATION AND CURETTAGE OF UTERUS  2017   after miscarriage    No current facility-administered medications on file prior to encounter.   Current Outpatient Medications on File Prior to Encounter  Medication Sig Dispense Refill   clindamycin (CLEOCIN T) 1 % lotion Apply topically every morning.     Prenatal Vit-Fe Fumarate-FA (PRENATAL PO) Prenatal      Allergies  Allergen Reactions   Guaifenesin Hives, Itching and Swelling    And hives    Ibuprofen Hives, Itching and Swelling   Measles And Rubella Virus Vaccine Rash    MMR- advised not to have 2nd- had rash, swelling and high fever    Social History   Socioeconomic History   Marital status: Married    Spouse name: Not on file   Number of children: Not on file   Years of education: Not on file   Highest education level: Not on file  Occupational History   Not on file  Tobacco Use   Smoking status: Never   Smokeless tobacco: Never  Vaping Use   Vaping Use:  Never used  Substance and Sexual Activity   Alcohol use: Yes    Comment: occasionally   Drug use: Never   Sexual activity: Yes    Birth control/protection: Condom  Other Topics Concern   Not on file  Social History Narrative   Not on file   Social Determinants of Health   Financial Resource Strain: Not on file  Food Insecurity: Not on file  Transportation Needs: Not on file  Physical Activity: Not on file  Stress: Not on file  Social Connections: Not on file  Intimate Partner Violence: Not on file    Family History  Problem Relation Age of Onset   Esophageal cancer Father    Breast cancer Maternal Grandmother    Heart attack Maternal Grandfather    Breast cancer Paternal Grandmother    Stroke Paternal Grandfather    Heart attack Paternal Grandfather      Review of Systems  Constitutional: Negative.   Respiratory: Negative.    Cardiovascular: Negative.   Gastrointestinal: Negative.   Genitourinary: Negative.   Neurological: Negative.   Psychiatric/Behavioral: Negative.       Physical Exam: BP 111/75 (BP Location: Left Arm)   Pulse (!) 101   Temp 98.3 F (36.8 C) (Oral)   Resp 16   Ht 5' 9"  (1.753 m)   Wt 103.4 kg   LMP 10/20/2021   BMI 33.67 kg/m   Physical Exam  Constitutional:      Appearance: Normal appearance.  Pulmonary:     Effort: Pulmonary effort is normal.  Abdominal:     Comments: gravid  Musculoskeletal:     Cervical back: Normal range of motion.     Right lower leg: No edema.     Left lower leg: No edema.  Neurological:     General: No focal deficit present.     Mental Status: She is alert.  Skin:    General: Skin is warm.  Psychiatric:        Mood and Affect: Mood normal.   NGI:TJLLVDIX 130, moderate variably, pos accel, neg decel  TOCO: irregular  Consults: None  Significant Findings/ Diagnostic Studies: NA  Procedures: RNST  Hospital Course: The patient was admitted to Labor and Delivery Triage for observation. Her cervical  exam remained unchanged.   Discharge Condition: good  Disposition: Discharge disposition: 01-Home or Self Care       Diet: Regular diet  Discharge Activity: Activity as tolerated   Allergies as of 07/24/2022       Reactions   Guaifenesin Hives, Itching, Swelling   And hives   Ibuprofen Hives, Itching, Swelling   Measles And Rubella Virus Vaccine Rash   MMR- advised not to have 2nd- had rash, swelling and high fever        Medication List     TAKE these medications    clindamycin 1 % lotion Commonly known as: CLEOCIN T Apply topically every morning.   PRENATAL PO Prenatal          Signed: Jillene Bucks [facility], CNM  07/25/2022, 5:06 PM

## 2022-07-24 NOTE — OB Triage Note (Signed)

## 2022-07-26 ENCOUNTER — Ambulatory Visit (INDEPENDENT_AMBULATORY_CARE_PROVIDER_SITE_OTHER): Payer: 59 | Admitting: Obstetrics & Gynecology

## 2022-07-26 ENCOUNTER — Encounter: Payer: Self-pay | Admitting: Obstetrics & Gynecology

## 2022-07-26 VITALS — BP 120/80 | Wt 230.0 lb

## 2022-07-26 DIAGNOSIS — O09523 Supervision of elderly multigravida, third trimester: Secondary | ICD-10-CM

## 2022-07-26 DIAGNOSIS — Z3A39 39 weeks gestation of pregnancy: Secondary | ICD-10-CM

## 2022-07-26 DIAGNOSIS — O99210 Obesity complicating pregnancy, unspecified trimester: Secondary | ICD-10-CM

## 2022-07-26 NOTE — Progress Notes (Signed)
Subjective:    Janet Ruiz is a 36 y.o. female being seen today for her obstetrical visit. She is at [redacted]w[redacted]d gestation. Patient reports no complaints. Fetal movement: normal.  Review of Systems:   Review of Systems   Objective:    BP 120/80   Wt 230 lb (104.3 kg)   LMP 10/20/2021   BMI 33.97 kg/m   Physical Exam  Exam  See flow sheet for exam details.  Assessment:    Pregnancy:  G3P1011    Plan:    Patient Active Problem List   Diagnosis Date Noted  . Uterine contractions 07/24/2022  . Vulvar varicose veins 05/25/2022  . AMA (advanced maternal age) multigravida 35+ 04/27/2022  . History of 3rd/4th degree laceration  04/27/2022  . Elevated BMI in pregnancy 04/27/2022  . History of pregnancy induced hypertension 04/27/2022  . Supervision of high risk pregnancy, antepartum 12/15/2021   I called L&D about augmentation today and tomorrow, but the inductions slots are full both days. She will go home and await labor. Labor precautions reviewed. Come back 1 week if still pregnancy.

## 2022-07-27 ENCOUNTER — Telehealth: Payer: Self-pay

## 2022-07-27 NOTE — Telephone Encounter (Signed)
Pt saw Dr. Marice Potter yesterday; is 40w today and 5cm dilatted; states Dr. Marice Potter was going to try to make her an appt to go to hosp to have her water broke Mon or Tues but there was no openings available; pt didn't know if they had anything this week so she can get child care lined up - this is her main concern.  [phone]

## 2022-07-28 ENCOUNTER — Encounter: Payer: Self-pay | Admitting: Licensed Practical Nurse

## 2022-07-29 ENCOUNTER — Ambulatory Visit (INDEPENDENT_AMBULATORY_CARE_PROVIDER_SITE_OTHER): Payer: 59 | Admitting: Licensed Practical Nurse

## 2022-07-29 VITALS — BP 128/80 | Wt 230.0 lb

## 2022-07-29 DIAGNOSIS — Z3480 Encounter for supervision of other normal pregnancy, unspecified trimester: Secondary | ICD-10-CM

## 2022-07-29 NOTE — Progress Notes (Signed)
Routine Prenatal Care Visit  Subjective  Janet Ruiz is a 36 y.o. G3P1011 at [redacted]w[redacted]d being seen today for ongoing prenatal care.  She is currently monitored for the following issues for this low-risk pregnancy and has Supervision of high risk pregnancy, antepartum; AMA (advanced maternal age) multigravida 35+; History of 3rd/4th degree laceration ; Elevated BMI in pregnancy; History of pregnancy induced hypertension; Vulvar varicose veins; and Uterine contractions on their problem list.  ----------------------------------------------------------------------------------- Patient reports  contractions .  Here for membrane sweep.  Has some contractions last night but they have gone away.  Aware she may need an IOL at 41 weeks.  Contractions: Irregular. Vag. Bleeding: None.  Movement: Present. Leaking Fluid denies.  ----------------------------------------------------------------------------------- The following portions of the patient's history were reviewed and updated as appropriate: allergies, current medications, past family history, past medical history, past social history, past surgical history and problem list. Problem list updated.  Objective  Blood pressure 128/80, weight 230 lb (104.3 kg), last menstrual period 10/20/2021. Pregravid weight 212 lb (96.2 kg) Total Weight Gain 18 lb (8.165 kg) Urinalysis: Urine Protein    Urine Glucose    Fetal Status: Fetal Heart Rate (bpm): 140   Movement: Present  Presentation: Vertex  General:  Alert, oriented and cooperative. Patient is in no acute distress.  Skin: Skin is warm and dry. No rash noted.   Cardiovascular: Normal heart rate noted  Respiratory: Normal respiratory effort, no problems with respiration noted  Abdomen: Soft, gravid, appropriate for gestational age. Pain/Pressure: Present     Pelvic:  Cervical exam performed Dilation: 5 Effacement (%): 70 Station: -2  Extremities: Normal range of motion.  Edema: Trace  Mental Status: Normal  mood and affect. Normal behavior. Normal judgment and thought content.   Assessment   36 y.o. G3P1011 at [redacted]w[redacted]d by  07/27/2022, by Last Menstrual Period presenting for work-in prenatal visit  Plan   prgenancy 3 Problems (from 12/15/21 to present)     Problem Noted Resolved   AMA (advanced maternal age) multigravida 35+ 04/27/2022 by Federico Flake, MD No   History of 3rd/4th degree laceration  04/27/2022 by Federico Flake, MD No   History of pregnancy induced hypertension 04/27/2022 by Federico Flake, MD No   Overview Signed 04/27/2022 11:02 AM by Federico Flake, MD    Induced at [redacted]w[redacted]d for elevated BP      Supervision of high risk pregnancy, antepartum 12/15/2021 by Ellwood Sayers, CNM No   Overview Addendum 07/06/2022  8:20 AM by Mirna Mires, CNM     Nursing Staff Provider  Office Location  [facility] Dating  [redacted]w[redacted]d 12/29/21  Language  English Anatomy US  Normal female  Flu Vaccine  2022 Genetic Screen  NIPS: neg, XX  TDaP vaccine   04/27/22  Hgb A1C or  GTT Early :5.6Third trimester : 128  Covid 03/2020    LAB RESULTS   Rhogam  NA Blood Type B/Positive/-- (12/27 1041)   Feeding Plan breast Antibody Negative (12/27 1041)  Contraception BTL-- immediate pp or with CS.  Rubella 1.19 (12/27 1041)  Circumcision  RPR Non Reactive (05/09 1023)   Pediatrician  [facility] HBsAg Negative (12/27 1041)   Support Person  HIV Non Reactive (05/09 1023)  Prenatal Classes  Varicella immune    GBS negative  BTL Consent     VBAC Consent  Pap  neg 2022    Hgb Electro    Pelvis Tested  CF      SMA  labor symptoms and general obstetric precautions including but not limited to vaginal bleeding, contractions, leaking of fluid and fetal movement were reviewed in detail with the patient. Please refer to After Visit Summary for other counseling recommendations.   Next apt Monday [place] for AFI ordered  L and D currently fully booked  for IOL, will need to be added on once she reaches 41 weeks  Carie Caddy, CNM  Domingo Pulse, [facility]  07/29/22  11:56 AM

## 2022-07-30 ENCOUNTER — Ambulatory Visit
Admission: RE | Admit: 2022-07-30 | Discharge: 2022-07-30 | Disposition: A | Discharge status: Home / Self Care | Payer: 59 | Source: Ambulatory Visit | Attending: Licensed Practical Nurse | Admitting: Licensed Practical Nurse

## 2022-07-30 DIAGNOSIS — Z3480 Encounter for supervision of other normal pregnancy, unspecified trimester: Secondary | ICD-10-CM | POA: Diagnosis not present

## 2022-08-02 ENCOUNTER — Ambulatory Visit (INDEPENDENT_AMBULATORY_CARE_PROVIDER_SITE_OTHER): Payer: 59 | Admitting: Obstetrics & Gynecology

## 2022-08-02 ENCOUNTER — Encounter: Payer: Self-pay | Admitting: Obstetrics & Gynecology

## 2022-08-02 VITALS — BP 112/80 | Wt 232.6 lb

## 2022-08-02 DIAGNOSIS — Z3A40 40 weeks gestation of pregnancy: Secondary | ICD-10-CM

## 2022-08-02 LAB — POCT URINALYSIS DIPSTICK OB
Glucose, UA: NEGATIVE
POC,PROTEIN,UA: NEGATIVE

## 2022-08-03 ENCOUNTER — Telehealth: Payer: Self-pay

## 2022-08-03 ENCOUNTER — Telehealth: Payer: Self-pay | Admitting: Obstetrics and Gynecology

## 2022-08-03 NOTE — Telephone Encounter (Signed)
Pt tx'd from front desk; has questions about IOL tomorrow: 1. Will her water be broken or will medication be used; adv medication will be used; when membranes are stripped that's usually done in office. 2. Is it okay to eat before she comes in; adv okay to eat just nothing heavy. 3. Where does she enter the hosp; adv ER; also pt asked what time she should actually arrive if her time is 8am; adv 7:30.  Wished pt well.

## 2022-08-03 NOTE — Telephone Encounter (Signed)
Called to inform patient of need to move IOL date from 8/16 to 8/17 due to low staffing of Labor and Delivery.  Patient had recent visit in office yesterday, with advanced cervical dilation, had membrane sweeping. Advised that if labor ensues she is to still report to L&D prior to induction date. Given general instructions for induction of labor. Patient notes understanding. Will report to L&D on 8/17 at 0800.

## 2022-08-04 ENCOUNTER — Inpatient Hospital Stay
Admission: RE | Admit: 2022-08-04 | Discharge: 2022-08-05 | DRG: 807 | Disposition: A | Discharge status: Home / Self Care | Payer: 59 | Source: Ambulatory Visit | Attending: Certified Nurse Midwife | Admitting: Certified Nurse Midwife

## 2022-08-04 ENCOUNTER — Inpatient Hospital Stay: Payer: 59 | Admitting: Anesthesiology

## 2022-08-04 ENCOUNTER — Encounter: Payer: Self-pay | Admitting: Obstetrics and Gynecology

## 2022-08-04 ENCOUNTER — Other Ambulatory Visit: Payer: Self-pay

## 2022-08-04 DIAGNOSIS — Z3A41 41 weeks gestation of pregnancy: Secondary | ICD-10-CM

## 2022-08-04 DIAGNOSIS — O480 Post-term pregnancy: Principal | ICD-10-CM | POA: Diagnosis present

## 2022-08-04 DIAGNOSIS — Z3490 Encounter for supervision of normal pregnancy, unspecified, unspecified trimester: Secondary | ICD-10-CM | POA: Diagnosis present

## 2022-08-04 DIAGNOSIS — O09513 Supervision of elderly primigravida, third trimester: Secondary | ICD-10-CM

## 2022-08-04 LAB — CBC
HCT: 29 % — ABNORMAL LOW (ref 36.0–46.0)
Hemoglobin: 9.4 g/dL — ABNORMAL LOW (ref 12.0–15.0)
MCH: 27.7 pg (ref 26.0–34.0)
MCHC: 32.4 g/dL (ref 30.0–36.0)
MCV: 85.5 fL (ref 80.0–100.0)
Platelets: 249 10*3/uL (ref 150–400)
RBC: 3.39 MIL/uL — ABNORMAL LOW (ref 3.87–5.11)
RDW: 13.5 % (ref 11.5–15.5)
WBC: 11.5 10*3/uL — ABNORMAL HIGH (ref 4.0–10.5)
nRBC: 0 % (ref 0.0–0.2)

## 2022-08-04 LAB — ABO/RH: ABO/RH(D): B POS

## 2022-08-04 LAB — TYPE AND SCREEN
ABO/RH(D): B POS
Antibody Screen: NEGATIVE

## 2022-08-04 MED ORDER — PHENYLEPHRINE 80 MCG/ML (10ML) SYRINGE FOR IV PUSH (FOR BLOOD PRESSURE SUPPORT): 80.0000 ug | PREFILLED_SYRINGE | INTRAVENOUS | Status: DC | PRN

## 2022-08-04 MED ORDER — LIDOCAINE-EPINEPHRINE (PF) 1.5 %-1:200000 IJ SOLN
INTRAMUSCULAR | Status: DC | PRN
  Administered 2022-08-04: 3 mL via EPIDURAL

## 2022-08-04 MED ORDER — OXYTOCIN-SODIUM CHLORIDE 30-0.9 UT/500ML-% IV SOLN
INTRAVENOUS | Status: AC
  Administered 2022-08-04: 2 m[IU]/min via INTRAVENOUS
  Filled 2022-08-04: qty 500

## 2022-08-04 MED ORDER — SIMETHICONE 80 MG PO CHEW: 80.0000 mg | CHEWABLE_TABLET | ORAL | Status: DC | PRN

## 2022-08-04 MED ORDER — ONDANSETRON HCL 4 MG/2ML IJ SOLN: 4.0000 mg | INTRAMUSCULAR | Status: DC | PRN

## 2022-08-04 MED ORDER — FENTANYL CITRATE (PF) 100 MCG/2ML IJ SOLN: 50.0000 ug | INTRAMUSCULAR | Status: DC | PRN

## 2022-08-04 MED ORDER — OXYTOCIN 10 UNIT/ML IJ SOLN
INTRAMUSCULAR | Status: AC
  Filled 2022-08-04: qty 2

## 2022-08-04 MED ORDER — EPHEDRINE 5 MG/ML INJ: 10.0000 mg | INTRAVENOUS | Status: DC | PRN

## 2022-08-04 MED ORDER — LACTATED RINGERS IV SOLN: INTRAVENOUS | Status: DC

## 2022-08-04 MED ORDER — WITCH HAZEL-GLYCERIN EX PADS: 1.0000 | MEDICATED_PAD | CUTANEOUS | Status: DC | PRN

## 2022-08-04 MED ORDER — DIPHENHYDRAMINE HCL 50 MG/ML IJ SOLN: 12.5000 mg | INTRAMUSCULAR | Status: DC | PRN

## 2022-08-04 MED ORDER — LACTATED RINGERS IV SOLN: 500.0000 mL | INTRAVENOUS | Status: DC | PRN

## 2022-08-04 MED ORDER — MISOPROSTOL 200 MCG PO TABS
ORAL_TABLET | ORAL | Status: AC
  Filled 2022-08-04: qty 3

## 2022-08-04 MED ORDER — AMMONIA AROMATIC IN INHA
RESPIRATORY_TRACT | Status: AC
  Filled 2022-08-04: qty 10

## 2022-08-04 MED ORDER — OXYTOCIN-SODIUM CHLORIDE 30-0.9 UT/500ML-% IV SOLN
2.5000 [IU]/h | INTRAVENOUS | Status: DC
Start: 2022-08-04 — End: 2022-08-04

## 2022-08-04 MED ORDER — PRENATAL MULTIVITAMIN CH
1.0000 | ORAL_TABLET | Freq: Every day | ORAL | Status: DC
  Filled 2022-08-04: qty 1

## 2022-08-04 MED ORDER — LIDOCAINE HCL (PF) 1 % IJ SOLN
INTRAMUSCULAR | Status: DC | PRN
  Administered 2022-08-04: 3 mL

## 2022-08-04 MED ORDER — BUPIVACAINE HCL (PF) 0.25 % IJ SOLN
INTRAMUSCULAR | Status: DC | PRN
  Administered 2022-08-04 (×2): 4 mL via EPIDURAL

## 2022-08-04 MED ORDER — OXYTOCIN-SODIUM CHLORIDE 30-0.9 UT/500ML-% IV SOLN: 1.0000 m[IU]/min | INTRAVENOUS | Status: DC

## 2022-08-04 MED ORDER — ONDANSETRON HCL 4 MG/2ML IJ SOLN: 4.0000 mg | Freq: Four times a day (QID) | INTRAMUSCULAR | Status: DC | PRN

## 2022-08-04 MED ORDER — WITCH HAZEL-GLYCERIN EX PADS
MEDICATED_PAD | CUTANEOUS | Status: AC
  Administered 2022-08-04: 1 via TOPICAL
  Filled 2022-08-04: qty 100

## 2022-08-04 MED ORDER — DOCUSATE SODIUM 100 MG PO CAPS
100.0000 mg | ORAL_CAPSULE | Freq: Two times a day (BID) | ORAL | Status: DC
  Administered 2022-08-04 – 2022-08-05 (×2): 100 mg via ORAL
  Filled 2022-08-04 (×2): qty 1

## 2022-08-04 MED ORDER — FENTANYL-BUPIVACAINE-NACL 0.5-0.125-0.9 MG/250ML-% EP SOLN
12.0000 mL/h | EPIDURAL | Status: DC | PRN
  Administered 2022-08-04: 12 mL/h via EPIDURAL
  Filled 2022-08-04: qty 250

## 2022-08-04 MED ORDER — LACTATED RINGERS IV SOLN: 500.0000 mL | Freq: Once | INTRAVENOUS | Status: DC

## 2022-08-04 MED ORDER — OXYTOCIN BOLUS FROM INFUSION
333.0000 mL | Freq: Once | INTRAVENOUS | Status: AC
  Administered 2022-08-04: 333 mL via INTRAVENOUS

## 2022-08-04 MED ORDER — DIBUCAINE (PERIANAL) 1 % EX OINT: 1.0000 | TOPICAL_OINTMENT | CUTANEOUS | Status: DC | PRN

## 2022-08-04 MED ORDER — LIDOCAINE HCL (PF) 1 % IJ SOLN
30.0000 mL | INTRAMUSCULAR | Status: DC | PRN
  Filled 2022-08-04: qty 30

## 2022-08-04 MED ORDER — DIPHENHYDRAMINE HCL 25 MG PO CAPS: 25.0000 mg | ORAL_CAPSULE | Freq: Four times a day (QID) | ORAL | Status: DC | PRN

## 2022-08-04 MED ORDER — ACETAMINOPHEN 500 MG PO TABS
1000.0000 mg | ORAL_TABLET | Freq: Four times a day (QID) | ORAL | Status: DC
  Administered 2022-08-04 – 2022-08-05 (×3): 1000 mg via ORAL
  Filled 2022-08-04 (×3): qty 2

## 2022-08-04 MED ORDER — SOD CITRATE-CITRIC ACID 500-334 MG/5ML PO SOLN: 30.0000 mL | ORAL | Status: DC | PRN

## 2022-08-04 MED ORDER — ZOLPIDEM TARTRATE 5 MG PO TABS: 5.0000 mg | ORAL_TABLET | Freq: Every evening | ORAL | Status: DC | PRN

## 2022-08-04 MED ORDER — BENZOCAINE-MENTHOL 20-0.5 % EX AERO: 1.0000 | INHALATION_SPRAY | CUTANEOUS | Status: DC | PRN

## 2022-08-04 MED ORDER — TERBUTALINE SULFATE 1 MG/ML IJ SOLN: 0.2500 mg | Freq: Once | INTRAMUSCULAR | Status: DC | PRN

## 2022-08-04 MED ORDER — BENZOCAINE-MENTHOL 20-0.5 % EX AERO
INHALATION_SPRAY | CUTANEOUS | Status: AC
  Administered 2022-08-04: 1 via TOPICAL
  Filled 2022-08-04: qty 56

## 2022-08-04 MED ORDER — COCONUT OIL OIL
1.0000 | TOPICAL_OIL | Status: DC | PRN
Start: 2022-08-04 — End: 2022-08-05

## 2022-08-04 MED ORDER — ONDANSETRON HCL 4 MG PO TABS: 4.0000 mg | ORAL_TABLET | ORAL | Status: DC | PRN

## 2022-08-04 NOTE — Discharge Summary (Signed)
Obstetrical Discharge Summary  Date of Admission: 08/04/2022 Date of Discharge: 08/05/2022  Primary OB: [facility]  Gestational Age at Delivery: [redacted]w[redacted]d  Antepartum complications: none Reason for Admission: Postdates IOL Date of Delivery: 08/04/2022 at 1636  Delivered By: LLawanda Cousins CNM  Delivery Type: spontaneous vaginal delivery Intrapartum complications/course: None Anesthesia: epidural Placenta: Delivered and expressed via active management. Intact: yes. To pathology: no.  Laceration: 2nd degree Episiotomy: none EBL: 1525mBaby: Liveborn female, APGARs 9/10,    Discharge Diagnosis: Delivered.   Postpartum course: normal Discharge Vital Signs:  Current Vital Signs 24h Vital Sign Ranges  T 98.5 F (36.9 C) Temp  Avg: 98.4 F (36.9 C)  Min: 98 F (36.7 C)  Max: 98.9 F (37.2 C)  BP 122/66 BP  Min: 99/60  Max: 150/77  HR 71 Pulse  Avg: 88.3  Min: 71  Max: 127  RR 18 Resp  Avg: 18.7  Min: 16  Max: 20  SaO2 100 %   SpO2  Avg: 96.9 %  Min: 60 %  Max: 100 %       24 Hour I/O Current Shift I/O  Time Ins Outs 08/16 0701 - 08/17 0700 In: -  Out: 1300 [Urine:1150] No intake/output data recorded.     Patient Vitals for the past 6 hrs:  BP Temp Pulse Resp SpO2  08/05/22 0828 122/66 98.5 F (36.9 C) 71 18 100 %    Discharge Exam:  NAD Perineum: mild swelling Abdomen: firm fundus below the umbilicus, NTTP, non distended, +bowel sounds.  Incision c/d/i with n/a  RRR no MRGs CTAB Ext: no c/c/e  Recent Labs  Lab 08/04/22 1036 08/05/22 0543  WBC 11.5* 13.4*  HGB 9.4* 8.5*  HCT 29.0* 26.9*  PLT 249 195    Disposition: Home  Rh Immune globulin given: no Rubella vaccine given: no Tdap vaccine given in AP or PP setting: yes Flu vaccine given in AP or PP setting: no  Contraception: vasectomy, considering IUD  Prenatal/Postnatal Panel: B POS [facility] [place] [id number]/[facility]//Varicella Immune//RPR  negative//HIV negative/HepB Surface Ag negative//pap no abnormalities (date: 07/2021)//plans to breastfeed  Plan:  Janet Diegoas discharged to home in good condition. Follow-up appointment with LMD in 2 and 6  weeks for a PP  visit  No future appointments.  Discharge Medications: Allergies as of 08/05/2022       Reactions   Guaifenesin Hives, Itching, Swelling   And hives   Ibuprofen Hives, Itching, Swelling   Measles And Rubella Virus Vaccine Rash   MMR- advised not to have 2nd- had rash, swelling and high fever        Medication List     TAKE these medications    clindamycin 1 % lotion Commonly known as: CLEOCIN T Apply topically every morning.   PRENATAL PO Prenatal        AnPhilip AspenCNM

## 2022-08-04 NOTE — Anesthesia Preprocedure Evaluation (Signed)
Anesthesia Evaluation  Patient identified by MRN, date of birth, ID band  Reviewed: Allergy & Precautions, H&P , NPO status , Patient's Chart, lab work & pertinent test results  Airway Mallampati: II  TM Distance: >3 FB Neck ROM: full    Dental no notable dental hx.    Pulmonary neg pulmonary ROS,    Pulmonary exam normal        Cardiovascular negative cardio ROS Normal cardiovascular exam     Neuro/Psych negative neurological ROS  negative psych ROS   GI/Hepatic Neg liver ROS, GERD  Controlled,  Endo/Other  negative endocrine ROS  Renal/GU negative Renal ROS  negative genitourinary   Musculoskeletal   Abdominal   Peds  Hematology negative hematology ROS (+)   Anesthesia Other Findings   Reproductive/Obstetrics (+) Pregnancy                             Anesthesia Physical Anesthesia Plan  ASA: 2  Anesthesia Plan: Epidural   Post-op Pain Management:    Induction:   PONV Risk Score and Plan:   Airway Management Planned:   Additional Equipment:   Intra-op Plan:   Post-operative Plan:   Informed Consent:   Plan Discussed with: Anesthesiologist and CRNA  Anesthesia Plan Comments:         Anesthesia Quick Evaluation

## 2022-08-04 NOTE — Progress Notes (Signed)
   Subjective:  Starting to feel contractions, but "not bad".  Offered AROM, agreeable to plan.   Objective:   Vitals: Blood pressure 134/72, pulse 77, temperature 98.4 F (36.9 C), temperature source Oral, resp. rate 20, height 5\' 9"  (1.753 m), weight 104.3 kg, last menstrual period 10/20/2021. General:  Abdomen: Cervical Exam:  Dilation: 5.5 Effacement (%): 50 Cervical Position: Middle Station: -1 Presentation: Vertex Exam by:: [email or web address], CNM  FHT: baseline 140, moderate variability, pos accel, neg decel  Toco:q 3-3.5  Pitocin at 14 milli-units/hr  Results for orders placed or performed during the hospital encounter of 08/04/22 (from the past 24 hour(s))  Type and screen [facility]     Status: None   Collection Time: 08/04/22 10:36 AM  Result Value Ref Range   ABO/RH(D) B POS    Antibody Screen NEG    Sample Expiration      08/07/2022,2359 Performed at [facility], [address] [place], [place]   CBC     Status: Abnormal   Collection Time: 08/04/22 10:36 AM  Result Value Ref Range   WBC 11.5 (H) 4.0 - 10.5 K/uL   RBC 3.39 (L) 3.87 - 5.11 MIL/uL   Hemoglobin 9.4 (L) 12.0 - 15.0 g/dL   HCT 08/06/22 (L) 09.3 - 11.2 %   MCV 85.5 80.0 - 100.0 fL   MCH 27.7 26.0 - 34.0 pg   MCHC 32.4 30.0 - 36.0 g/dL   RDW 16.2 44.6 - 95.0 %   Platelets 249 150 - 400 K/uL   nRBC 0.0 0.0 - 0.2 %  ABO/Rh     Status: None   Collection Time: 08/04/22 11:10 AM  Result Value Ref Range   ABO/RH(D)      B POS Performed at [facility], [address] [place], [place]     Assessment:   36 y.o. [phone] [redacted]w[redacted]d admitted for IOL   Plan:   1) Labor -Pitocin infusing, AROM for mec, reevaluate in 4 hours. NICU at birth for mec   2) Fetus - category 1 tracing   3) ID: GBS neg, AROM for mec at 1444   4) Pain management: desires epidural   [redacted]w[redacted]d, CNM  Carie Caddy, [facility]  @TODAY @  3:37 PM

## 2022-08-04 NOTE — H&P (Signed)
OB History & Physical   History of Present Illness:  Chief Complaint:   HPI:  Janet Ruiz is a 36 y.o. G13P1011 female at 62w1ddated by 9wk [place]  She presents to L&D for induction of labor secondary to postdates. She endorses +FM, has had some contractions but nothing consistent. Denies LOF/VB She has had early and regular prenatal care. This pregnancy has been uncomplicated     Pregnancy Issues: 1. AMA  Maternal Medical History:   Past Medical History:  Diagnosis Date   No known health problems     Past Surgical History:  Procedure Laterality Date   DILATION AND CURETTAGE OF UTERUS  2017   after miscarriage    Allergies  Allergen Reactions   Guaifenesin Hives, Itching and Swelling    And hives    Ibuprofen Hives, Itching and Swelling   Measles And Rubella Virus Vaccine Rash    MMR- advised not to have 2nd- had rash, swelling and high fever    Prior to Admission medications   Medication Sig Start Date End Date Taking? Authorizing Provider  clindamycin (CLEOCIN T) 1 % lotion Apply topically every morning. 04/20/21   [provider]  Prenatal Vit-Fe Fumarate-FA (PRENATAL PO) Prenatal    [provider]     Prenatal care site: [facility]  Social History: She  reports that she has never smoked. She has never used smokeless tobacco. She reports that she does not drink alcohol.  Family History: family history includes Breast cancer in her maternal grandmother and paternal grandmother; Esophageal cancer in her father; Heart attack in her maternal grandfather and paternal grandfather; Stroke in her paternal grandfather.   Review of Systems: A full review of systems was performed and negative except as noted in the HPI.     Physical Exam:  Vital Signs: BP 131/80 (BP Location: Right Arm)   Pulse 87   Temp 98.9 F (37.2 C) (Oral)   Resp 16   Ht _0  (1.753 m)   Wt 104.3 kg   LMP 10/20/2021   BMI 33.97 kg/m  General: no acute distress.  HEENT:  normocephalic, atraumatic Heart: regular rate & rhythm.  No murmurs/rubs/gallops Lungs: clear to auscultation bilaterally, normal respiratory effort Abdomen: soft, gravid, non-tender;  EFW: 8lbs Pelvic:   External: Normal external female genitalia  Cervix: Dilation: 4.5 / Effacement (%): 50 / Station: -2    Extremities: non-tender, symmetric, no edema bilaterally.  Neurologic: Alert & oriented x 3.    Results for orders placed or performed during the hospital encounter of 08/04/22 (from the past 24 hour(s))  Type and screen [facility]    Status: None (Preliminary result)   Collection Time: 08/04/22 10:36 AM  Result Value Ref Range   ABO/RH(D) PENDING    Antibody Screen PENDING    Sample Expiration      08/07/2022,2359 [facility]formed at [facility] [address] [place] [address]  CBC     Status: Abnormal   Collection Time: 08/04/22 10:36 AM  Result Value Ref Range   WBC 11.5 (H) 4.0 - 10.5 K/uL   RBC 3.39 (L) 3.87 - 5.11 MIL/uL   Hemoglobin 9.4 (L) 12.0 - 15.0 g/dL   HCT 29.0 (L) 36.0 - 46.0 %   MCV 85.5 80.0 - 100.0 fL   MCH 27.7 26.0 - 34.0 pg   MCHC 32.4 30.0 - 36.0 g/dL   RDW 13.5 11.5 - 15.5 %   Platelets 249 150 - 400  K/uL   nRBC 0.0 0.0 - 0.2 %    Pertinent Results:  Prenatal Labs: Blood type/Rh B+  Antibody screen neg  Rubella Immune  Varicella Immune  RPR NR  HBsAg Neg  HIV NR  GC neg  Chlamydia neg  Genetic screening negative  1 hour GTT 128  3 hour GTT   GBS neg   GQH:QIXMDEKI 140, moderate variability, pos accel, neg decel  TOCO:irregular  SVE:  Dilation: 4.5 / Effacement (%): 50 / Station: -2    Cephalic by [employer]  US OB Limited  Result Date: 08/01/2022 CLINICAL DATA:  Post-dates AFI. EXAM: LIMITED OBSTETRIC ULTRASOUND COMPARISON:  None Available. FINDINGS: Number of Fetuses: 1 Heart Rate:  133 bpm Movement: Yes Presentation: Cephalic Placental Location: Anterior Previa: No Amniotic Fluid  (Subjective):  Normal AFI: 12.8 cm BPD: 9.6 cm 39 w  2 d MATERNAL FINDINGS: Cervix:  Appears closed. Uterus/Adnexae: No abnormality visualized. IMPRESSION: The AFI is 12.8 cm.  Single live IUP. This exam is performed on an emergent basis and does not comprehensively evaluate fetal size, dating, or anatomy; follow-up complete OB US should be considered if further fetal assessment is warranted. Electronically Signed   By: Dorise Bullion III M.D.   On: 08/01/2022 18:41    Assessment:  Janet Ruiz is a 36 y.o. G35P1011 female at 74w1dwith postdates induction.   Plan:  Admit to Labor & Delivery CBC, T&S, Clrs, IVF, Pitocin then AROM when able GBS  negative Consents obtained. Continuous efm/toco Pain management: desires Epidural  ----- LRoberto Scales CAntelope

## 2022-08-04 NOTE — Anesthesia Procedure Notes (Signed)
Epidural Patient location during procedure: OB Start time: 08/04/2022 3:45 PM End time: 08/04/2022 3:57 PM  Staffing Anesthesiologist: Lenard Simmer, MD Resident/CRNA: Jeanine Luz, CRNA Performed: resident/CRNA   Preanesthetic Checklist Completed: patient identified, IV checked, site marked, risks and benefits discussed, surgical consent, monitors and equipment checked and pre-op evaluation  Epidural Patient position: sitting Prep: ChloraPrep Patient monitoring: heart rate, continuous pulse ox and blood pressure Approach: midline Location: L3-L4 Injection technique: LOR saline  Needle:  Needle type: Tuohy  Needle gauge: 17 G Needle length: 9 cm and 9 Needle insertion depth: 8 cm Catheter type: closed end flexible Catheter size: 19 Gauge Catheter at skin depth: 13 cm Test dose: negative and 1.5% lidocaine with Epi 1:200 K  Assessment Events: blood not aspirated, injection not painful, no injection resistance, no paresthesia and negative IV test  Additional Notes 1 attempt Pt. Evaluated and documentation done after procedure finished. Patient identified. Risks/Benefits/Options discussed with patient including but not limited to bleeding, infection, nerve damage, paralysis, failed block, incomplete pain control, headache, blood pressure changes, nausea, vomiting, reactions to medication both or allergic, itching and postpartum back pain. Confirmed with bedside nurse the patient's most recent platelet count. Confirmed with patient that they are not currently taking any anticoagulation, have any bleeding history or any family history of bleeding disorders. Patient expressed understanding and wished to proceed. All questions were answered. Sterile technique was used throughout the entire procedure. Please see nursing notes for vital signs. Test dose was given through epidural catheter and negative prior to continuing to dose epidural or start infusion. Warning signs of high block  given to the patient including shortness of breath, tingling/numbness in hands, complete motor block, or any concerning symptoms with instructions to call for help. Patient was given instructions on fall risk and not to get out of bed. All questions and concerns addressed with instructions to call with any issues or inadequate analgesia.    Patient tolerated the insertion well without immediate complications.Reason for block:procedure for pain

## 2022-08-05 LAB — CBC
HCT: 26.9 % — ABNORMAL LOW (ref 36.0–46.0)
Hemoglobin: 8.5 g/dL — ABNORMAL LOW (ref 12.0–15.0)
MCH: 27.8 pg (ref 26.0–34.0)
MCHC: 31.6 g/dL (ref 30.0–36.0)
MCV: 87.9 fL (ref 80.0–100.0)
Platelets: 195 10*3/uL (ref 150–400)
RBC: 3.06 MIL/uL — ABNORMAL LOW (ref 3.87–5.11)
RDW: 13.5 % (ref 11.5–15.5)
WBC: 13.4 10*3/uL — ABNORMAL HIGH (ref 4.0–10.5)
nRBC: 0 % (ref 0.0–0.2)

## 2022-08-05 LAB — RPR: RPR Ser Ql: NONREACTIVE

## 2022-08-05 NOTE — Final Progress Note (Signed)
Physician Final Progress Note  Patient ID: Janet Ruiz MRN: [id number] DOB/AGE: 1986-10-10 36 y.o.  Admit date: 08/04/2022 Admitting provider: Harlin Heys, MD Discharge date: 08/05/2022   Admission Diagnoses: induction of labor postdate  Discharge Diagnoses:  Principal Problem:   Encounter for induction of labor    Consults: None  Significant Findings/ Diagnostic Studies: GBS negative  Procedures: epidural  Discharge Condition: good  Disposition: Discharge disposition: 01-Home or Self Care       Diet: Regular diet  Discharge Activity: Activity as tolerated and No heavy lifting, pushing, pulling with the implant side for 4-6 weeks   Allergies as of 08/05/2022       Reactions   Guaifenesin Hives, Itching, Swelling   And hives   Ibuprofen Hives, Itching, Swelling   Measles And Rubella Virus Vaccine Rash   MMR- advised not to have 2nd- had rash, swelling and high fever        Medication List     TAKE these medications    clindamycin 1 % lotion Commonly known as: CLEOCIN T Apply topically every morning.   PRENATAL PO Prenatal        Follow-up Information     Dominic, Nunzio Cobbs, CNM Follow up in 2 week(s).   Specialty: Obstetrics and Gynecology Why: and 6wks PP visit Contact information: [address] [place] [address] [phone]                 Total time spent taking care of this patient: 12 minutes  Signed: Philip Aspen 08/05/2022, 9:24 AM

## 2022-08-05 NOTE — Lactation Note (Signed)
This note was copied from a baby's chart. Lactation Consultation Note  Patient Name: Janet Ruiz [id number] Date: 08/05/2022 Reason for consult: Initial assessment;Term Age:35 hours  Maternal Data Has patient been taught Hand Expression?: Yes Does the patient have breastfeeding experience prior to this delivery?: Yes How long did the patient breastfeed?: 4 months  P2, SVD 17 hours ago. Her first child had latching difficulties and mom transitioned to pump/bottle feeding with low milk supply. She desires BF with this baby and reports that she feeling more confident with feedings already.  Feeding Mother's Current Feeding Choice: Breast Milk  Baby has been feeding well, Transitioned monitor feed yesterday and baby did well. Several voids/stools since delivery; exceeding expectations.   LATCH Score Latch: Grasps breast easily, tongue down, lips flanged, rhythmical sucking.  Audible Swallowing: Spontaneous and intermittent  Type of Nipple: Everted at rest and after stimulation  Comfort (Breast/Nipple): Soft / non-tender  Hold (Positioning): Assistance needed to correctly position infant at breast and maintain latch.  LATCH Score: 9  LC at bedside to assist with feeding post bath. Mom was independent with cross-cradle hold and sandwiching of the breast. Mom was everted nipples.   Baby began to cry before latching and initially had shallow latch. LC supported baby with re-latching. Deep latch achieved and rhythmic sucking pattern. Mom denies discomfort.  Lactation Tools Discussed/Used    Interventions Interventions: Breast feeding basics reviewed;Assisted with latch;Hand express;Breast compression;Adjust position;Education  Reviewed normal feeding pattern/behaviors in first 24 hours, achieving a deep latch/good position, output expectations.  Discharge Discharge Education: Engorgement and breast care;Warning signs for feeding baby;Outpatient recommendation Pump:  Personal  Consult Status Consult Status: Complete  Whiteboard updated with LC name/number.  Danford Bad 08/05/2022, 10:27 AM

## 2022-08-05 NOTE — Progress Notes (Addendum)
Patient responsive to reaching. Had no questions at this time.  Patient refused to wait for volunteer to wheel them out. Patient felt confident walking out alone.

## 2022-08-05 NOTE — Anesthesia Postprocedure Evaluation (Signed)
Anesthesia Post Note  Patient: Janet Ruiz  Procedure(s) Performed: AN AD HOC LABOR EPIDURAL  Patient location during evaluation: Mother Baby Anesthesia Type: Epidural Level of consciousness: awake and alert and oriented Pain management: pain level controlled Vital Signs Assessment: post-procedure vital signs reviewed and stable Respiratory status: respiratory function stable Cardiovascular status: stable Postop Assessment: no backache, no headache, patient able to bend at knees, no apparent nausea or vomiting, able to ambulate and adequate PO intake Anesthetic complications: no   No notable events documented.   Last Vitals:  Vitals:   08/04/22 2311 08/05/22 0313  BP: 123/71 (!) 100/55  Pulse: 89 79  Resp: 20 18  Temp: 37 C 36.8 C  SpO2: 98% 98%    Last Pain:  Vitals:   08/05/22 0345  TempSrc:   PainSc: 0-No pain                 Zachary George

## 2022-08-17 ENCOUNTER — Encounter: Payer: Self-pay | Admitting: Licensed Practical Nurse

## 2022-08-17 ENCOUNTER — Ambulatory Visit (INDEPENDENT_AMBULATORY_CARE_PROVIDER_SITE_OTHER): Payer: 59 | Admitting: Licensed Practical Nurse

## 2022-08-17 DIAGNOSIS — Z1332 Encounter for screening for maternal depression: Secondary | ICD-10-CM

## 2022-08-17 NOTE — Progress Notes (Signed)
Postpartum Visit  Chief Complaint:  Chief Complaint  Patient presents with   Postpartum Care    No complaints     History of Present Illness: Patient is a 36 y.o. G3P2012 presents for postpartum visit.  Date of delivery: 08/04/2022 Type of delivery: Vaginal delivery - Vacuum or forceps assisted  no Episiotomy No.  Laceration: yes 2nd degree  Pregnancy or labor problems:  no Any problems since the delivery:  no Bleeding: comes and goes, tan to red Sleep: first week was rough, now gets 3 hour chunks, unable to nap d/t caring for other child Mood: feels great (much better compared to first PP experience) Breastfeeding is going well, saw LC at Peds, helped with the latch. Has had sore nipples, using silver nipple shields. No concerns for perineum, voiding or stooling, using colace  Considering IUD   Newborn Details:  SINGLETON :  1. Baby's name: Emmy Birth weight: 8lbs 12oz Maternal Details:  Breast Feeding:  yes Post partum depression/anxiety noted:  no Edinburgh Post-Partum Depression Score:  2  Date of last PAP: 2022  normal   Past Medical History:  Diagnosis Date   No known health problems     Past Surgical History:  Procedure Laterality Date   DILATION AND CURETTAGE OF UTERUS  2017   after miscarriage    Prior to Admission medications   Medication Sig Start Date End Date Taking? Authorizing Provider  clindamycin (CLEOCIN T) 1 % lotion Apply topically every morning. 04/20/21  Yes [provider]  docusate sodium (COLACE) 100 MG capsule Take 100 mg by mouth 2 (two) times daily.   Yes [provider]  ferrous sulfate 325 (65 FE) MG EC tablet Take 325 mg by mouth 3 (three) times daily with meals.   Yes [provider]  Prenatal Vit-Fe Fumarate-FA (PRENATAL PO) Prenatal   Yes [provider]    Allergies  Allergen Reactions   Guaifenesin Hives, Itching and Swelling    And hives    Ibuprofen Hives, Itching and Swelling   Measles  And Rubella Virus Vaccine Rash    MMR- advised not to have 2nd- had rash, swelling and high fever     Social History   Socioeconomic History   Marital status: Married    Spouse name: Not on file   Number of children: Not on file   Years of education: Not on file   Highest education level: Not on file  Occupational History   Not on file  Tobacco Use   Smoking status: Never   Smokeless tobacco: Never  Vaping Use   Vaping Use: Never used  Substance and Sexual Activity   Alcohol use: Never    Comment: occasionally   Drug use: Not on file   Sexual activity: Yes    Birth control/protection: Condom  Other Topics Concern   Not on file  Social History Narrative   Not on file   Social Determinants of Health   Financial Resource Strain: Not on file  Food Insecurity: Not on file  Transportation Needs: Not on file  Physical Activity: Not on file  Stress: Not on file  Social Connections: Not on file  Intimate Partner Violence: Not on file    Family History  Problem Relation Age of Onset   Esophageal cancer Father    Breast cancer Maternal Grandmother    Heart attack Maternal Grandfather    Breast cancer Paternal Grandmother    Stroke Paternal Grandfather    Heart attack Paternal Grandfather       Review of Systems  Constitutional: Negative.   Respiratory: Negative.    Cardiovascular: Negative.   Gastrointestinal: Negative.   Genitourinary: Negative.   Musculoskeletal: Negative.   Psychiatric/Behavioral: Negative.       Physical Exam BP 110/78 (Patient Position: Sitting, Cuff Size: Normal)   Pulse 90   Ht 5' 9" (1.753 m)   Wt 206 lb (93.4 kg)   LMP 08/04/2022   Breastfeeding Yes   BMI 30.42 kg/m   Physical Exam Constitutional:      Appearance: Normal appearance.  Genitourinary:     Vulva normal.     Genitourinary Comments: Laceration healing, few stiches visible Varicosity present on Right labia majora   Pulmonary:     Effort: Pulmonary effort is normal.   Chest:     Comments: Breasts: soft, no redness or masses. Nipples erect and intact bilaterally.  Abdominal:     General: There is no distension.     Palpations: Abdomen is soft.     Tenderness: There is no abdominal tenderness.     Comments: Uterus not palpated in abdomen   Musculoskeletal:        General: No swelling.     Cervical back: Normal range of motion and neck supple.  Neurological:     General: No focal deficit present.     Mental Status: She is alert.  Skin:    General: Skin is warm.  Psychiatric:        Mood and Affect: Mood normal.     Assessment: 36 y.o. G3P2012 presenting for 2 week postpartum visit  Plan: Problem List Items Addressed This Visit   None    1) Contraception Education given regarding options for contraception, including IUD placement.  2)  Pap - ASCCP guidelines and rational discussed.  Patient opts for 5 year  screening interval due 2027  3) Patient underwent screening for postpartum depression with No concerns noted.  4) Follow up 4 wks PP exam   Lydia Dominic, CNM  [facility], [facility]  08/17/22  9:27 AM   

## 2022-08-29 ENCOUNTER — Ambulatory Visit
Admission: EM | Admit: 2022-08-29 | Discharge: 2022-08-29 | Disposition: A | Discharge status: Home / Self Care | Payer: 59 | Attending: Emergency Medicine | Admitting: Emergency Medicine

## 2022-08-29 DIAGNOSIS — R300 Dysuria: Secondary | ICD-10-CM | POA: Insufficient documentation

## 2022-08-29 LAB — POCT URINALYSIS DIP (MANUAL ENTRY)
Bilirubin, UA: NEGATIVE
Glucose, UA: NEGATIVE mg/dL
Ketones, POC UA: NEGATIVE mg/dL
Nitrite, UA: NEGATIVE
Protein Ur, POC: 30 mg/dL — AB
Spec Grav, UA: 1.015 (ref 1.010–1.025)
Urobilinogen, UA: 0.2 E.U./dL
pH, UA: 7 (ref 5.0–8.0)

## 2022-08-29 MED ORDER — CEPHALEXIN 500 MG PO CAPS
500.0000 mg | ORAL_CAPSULE | Freq: Two times a day (BID) | ORAL | 0 refills | Status: AC
Start: 2022-08-29 — End: 2022-09-03

## 2022-08-29 NOTE — ED Provider Notes (Signed)
[facility]    CSN: [id number] Arrival date & time: 08/29/22  1057      History   Chief Complaint Chief Complaint  Patient presents with   Urinary Frequency   Dysuria    HPI Janet Ruiz is a 36 y.o. female.  Patient presents with 2-day history of dysuria and urinary urgency.  She denies fever, chills, abdominal pain, flank pain, hematuria, pelvic pain, or other symptoms.  No treatments at home.  Patient has a 20-week-old infant; she had a checkup with her OB/GYN last week and reports she was healing well.  She reports ongoing scant vaginal bleeding.  She is breast-feeding.  The history is provided by the patient and medical records.    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 Authorizing Provider  cephALEXin (KEFLEX) 500 MG capsule Take 1 capsule (500 mg total) by mouth 2 (two) times daily for 5 days. 08/29/22 09/03/22 Yes Mickie Bail, NP  clindamycin (CLEOCIN T) 1 % lotion Apply topically every morning. 04/20/21   [provider]  docusate sodium (COLACE) 100 MG capsule Take 100 mg by mouth 2 (two) times daily.    [provider]  ferrous sulfate 325 (65 FE) MG EC tablet Take 325 mg by mouth 3 (three) times daily with meals.    [provider]  Prenatal Vit-Fe Fumarate-FA (PRENATAL PO) Prenatal    [provider]    Family History Family  History  Problem Relation Age of Onset   Esophageal cancer Father    Breast cancer Maternal Grandmother    Heart attack Maternal Grandfather    Breast cancer Paternal Grandmother    Stroke Paternal Grandfather    Heart attack Paternal Grandfather     Social History Social History   Tobacco Use   Smoking status: Never   Smokeless tobacco: Never  Vaping Use   Vaping Use: Never used  Substance Use Topics   Alcohol use: Never    Comment: occasionally     Allergies   Guaifenesin, Ibuprofen, and Measles and rubella virus vaccine   Review of Systems Review of Systems  Constitutional:  Negative for chills and fever.  Gastrointestinal:  Negative for abdominal pain, diarrhea, nausea and vomiting.  Genitourinary:  Positive for dysuria and urgency. Negative for flank pain, hematuria and pelvic pain.  Skin:  Negative for color change and rash.  All other systems reviewed and are negative.    Physical Exam Triage Vital Signs ED Triage Vitals  Enc Vitals Group     BP      Pulse  Resp      Temp      Temp src      SpO2      Weight      Height      Head Circumference      Peak Flow      Pain Score      Pain Loc      Pain Edu?      Excl. in GC?    No data found.  Updated Vital Signs BP 133/77   Pulse 81   Temp 98.1 F (36.7 C)   Resp 18   LMP 08/04/2022   SpO2 97%   Breastfeeding Yes   Visual Acuity Right Eye Distance:   Left Eye Distance:   Bilateral Distance:    Right Eye Near:   Left Eye Near:    Bilateral Near:     Physical Exam Vitals and nursing note reviewed.  Constitutional:      General: She is not in acute distress.    Appearance: Normal appearance. She is well-developed. She is not ill-appearing.  HENT:     Mouth/Throat:     Mouth: Mucous membranes are moist.  Cardiovascular:     Rate and Rhythm: Normal rate and regular rhythm.     Heart sounds: Normal heart sounds.  Pulmonary:     Effort: Pulmonary effort is normal. No respiratory  distress.     Breath sounds: Normal breath sounds.  Abdominal:     General: Bowel sounds are normal.     Palpations: Abdomen is soft.     Tenderness: There is no abdominal tenderness. There is no right CVA tenderness, left CVA tenderness, guarding or rebound.  Musculoskeletal:     Cervical back: Neck supple.  Skin:    General: Skin is warm and dry.  Neurological:     Mental Status: She is alert.  Psychiatric:        Mood and Affect: Mood normal.        Behavior: Behavior normal.      UC Treatments / Results  Labs (all labs ordered are listed, but only abnormal results are displayed) Labs Reviewed  POCT URINALYSIS DIP (MANUAL ENTRY) - Abnormal; Notable for the following components:      Result Value   Clarity, UA cloudy (*)    Blood, UA small (*)    Protein Ur, POC =30 (*)    Leukocytes, UA Moderate (2+) (*)    All other components within normal limits  URINE CULTURE    EKG   Radiology No results found.  Procedures Procedures (including critical care time)  Medications Ordered in UC Medications - No data to display  Initial Impression / Assessment and Plan / UC Course  I have reviewed the triage vital signs and the nursing notes.  Pertinent labs & imaging results that were available during my care of the patient were reviewed by me and considered in my medical decision making (see chart for details).   Dysuria.  Patient is well-appearing and her exam is reassuring.  Afebrile and vital signs are stable.  She is breast-feeding.  Treating with Keflex. Urine culture pending. Discussed with patient that we will call her if the urine culture shows the need to change or discontinue the antibiotic. Instructed her to follow-up with her PCP or OB/GYN if her symptoms are not improving. Patient agrees to plan of care.      Final Clinical Impressions(s) / UC Diagnoses   Final diagnoses:  Dysuria  Discharge Instructions      Take the antibiotic as directed.  The  urine culture is pending.  We will call you if it shows the need to change or discontinue your antibiotic.    Follow up with your primary care provider if your symptoms are not improving.        ED Prescriptions     Medication Sig Dispense Auth. Provider   cephALEXin (KEFLEX) 500 MG capsule Take 1 capsule (500 mg total) by mouth 2 (two) times daily for 5 days. 10 capsule Mickie Bail, NP      PDMP not reviewed this encounter.   Mickie Bail, NP 08/29/22 1159

## 2022-08-29 NOTE — ED Triage Notes (Signed)
Patient presents to [facility] for possible UTI. C/o of dysuria and urinary urgency since Friday.  Denies fever or hematuria.

## 2022-08-29 NOTE — Discharge Instructions (Addendum)
Take the antibiotic as directed.  The urine culture is pending.  We will call you if it shows the need to change or discontinue your antibiotic.    Follow up with your primary care provider if your symptoms are not improving.    

## 2022-08-31 LAB — URINE CULTURE: Culture: 50000 — AB

## 2022-09-07 ENCOUNTER — Encounter: Payer: Self-pay | Admitting: Obstetrics

## 2022-09-07 ENCOUNTER — Ambulatory Visit (INDEPENDENT_AMBULATORY_CARE_PROVIDER_SITE_OTHER): Payer: 59 | Admitting: Obstetrics

## 2022-09-07 VITALS — BP 122/70 | Wt 202.0 lb

## 2022-09-07 DIAGNOSIS — R300 Dysuria: Secondary | ICD-10-CM

## 2022-09-07 LAB — POCT URINALYSIS DIPSTICK
Bilirubin, UA: NEGATIVE
Blood, UA: NEGATIVE
Glucose, UA: NEGATIVE
Ketones, UA: NEGATIVE
Nitrite, UA: NEGATIVE
Protein, UA: NEGATIVE
Spec Grav, UA: 1.015 (ref 1.010–1.025)
Urobilinogen, UA: 0.2 E.U./dL
pH, UA: 7 (ref 5.0–8.0)

## 2022-09-07 MED ORDER — NITROFURANTOIN MONOHYD MACRO 100 MG PO CAPS: 100.0000 mg | ORAL_CAPSULE | Freq: Two times a day (BID) | ORAL | 0 refills | Status: DC

## 2022-09-07 NOTE — Addendum Note (Signed)
Addended by: Brien Few on: 09/07/2022 03:12 PM   Modules accepted: Orders

## 2022-09-07 NOTE — Progress Notes (Signed)
Janet Ruiz is a 36 y.o. [id number] who LMP was Patient's last menstrual period was 08/04/2022., presents today for a problem visit.  She is 5 weeks post vaginal delivery. She developed dysuria, and increased urege to urinate last week, and was treated with Keflex at an Urgent Care. She  did complete treatement, but again She complains of burning with urination and dysuria . She has had symptoms for 4 days. Patient also complains of  no other symptoms . Symptoms are moderate.  Patient denies back pain, stomach ache, and vaginal discharge. Patient does not have a history of recurrent UTI,  does not have a history of pyelonephritis, does not have a history of nephrolithiasis.  She has had previous treatment for her current symptoms as described above. BP 122/70   Wt 202 lb (91.6 kg)   LMP 08/04/2022   Breastfeeding Yes   BMI 29.83 kg/m  Results for orders placed or performed in visit on 09/07/22 (from the past 24 hour(s))  POCT Urinalysis Dipstick     Status: Abnormal   Collection Time: 09/07/22 10:09 AM  Result Value Ref Range   Color, UA yellow    Clarity, UA clear    Glucose, UA Negative Negative   Bilirubin, UA neg    Ketones, UA neg    Spec Grav, UA 1.015 1.010 - 1.025   Blood, UA neg    pH, UA 7.0 5.0 - 8.0   Protein, UA Negative Negative   Urobilinogen, UA 0.2 0.2 or 1.0 E.U./dL   Nitrite, UA neg    Leukocytes, UA Large (3+) (A) Negative   Appearance     Odor     A: 5 weeks postpartum patient with c/o dysuria, and urinary urgency    P: Urine sent for C and S.  She was treated with Keflex. Will RX Macrobid and then check on the Sensitivity testing. Suggested she add AZO to her treatment, and Monistat if she develops yeast vaginitis. She will f/u with Alma Friendly at her 6 wk PP visit next week.  .myus

## 2022-09-09 LAB — URINE CULTURE

## 2022-09-14 ENCOUNTER — Ambulatory Visit (INDEPENDENT_AMBULATORY_CARE_PROVIDER_SITE_OTHER): Payer: 59 | Admitting: Licensed Practical Nurse

## 2022-09-14 ENCOUNTER — Other Ambulatory Visit (HOSPITAL_COMMUNITY)
Admission: RE | Admit: 2022-09-14 | Discharge: 2022-09-14 | Disposition: A | Discharge status: Home / Self Care | Payer: 59 | Source: Ambulatory Visit | Attending: Licensed Practical Nurse | Admitting: Licensed Practical Nurse

## 2022-09-14 ENCOUNTER — Encounter: Payer: Self-pay | Admitting: Licensed Practical Nurse

## 2022-09-14 DIAGNOSIS — N898 Other specified noninflammatory disorders of vagina: Secondary | ICD-10-CM

## 2022-09-14 DIAGNOSIS — Z1332 Encounter for screening for maternal depression: Secondary | ICD-10-CM

## 2022-09-14 MED ORDER — ESTROGENS CONJUGATED 0.625 MG/GM VA CREA: 1.0000 | TOPICAL_CREAM | Freq: Every day | VAGINAL | 12 refills | Status: AC

## 2022-09-14 NOTE — Progress Notes (Signed)
Postpartum Visit  Chief Complaint:  Chief Complaint  Patient presents with   Postpartum Care    History of Present Illness: Patient is a 36 y.o. [id number] presents for postpartum visit.  Date of delivery: 08/04/2022 Type of delivery: Vaginal delivery - Vacuum or forceps assisted  no Episiotomy No.  Laceration: yes, 2nd degree  Pregnancy or labor problems:  no Any problems since the delivery:  no Continuing to take Fe Supplement Bleeding is faint, wears a liner  Breastfeeding is going well Sleep as expected with a NB, gets 3-4 hours stretches Has not had IC yet, considering IUD but does not want it today, her husband will get a vasectomy in Oct  Was recently treated for a UTI, has some stinging at her urethra  after urination  Walks for exercise Has Dental exam in November Eye exam last year Currently doing a little free lance work   Newborn Details:  SINGLETON :  1. Baby's name: Lowella Petties. Birth weight: 8lbs 12oz Maternal Details:  Breast Feeding:  yes Post partum depression/anxiety noted:  no Edinburgh Post-Partum Depression Score:  1  Date of last PAP: 07/2021  normal   Past Medical History:  Diagnosis Date   No known health problems     Past Surgical History:  Procedure Laterality Date   DILATION AND CURETTAGE OF UTERUS  2017   after miscarriage    Prior to Admission medications   Medication Sig Start Date End Date Taking? Authorizing Provider  clindamycin (CLEOCIN T) 1 % lotion Apply topically every morning. 04/20/21  Yes [provider]  docusate sodium (COLACE) 100 MG capsule Take 100 mg by mouth 2 (two) times daily.   Yes [provider]  ferrous sulfate 325 (65 FE) MG EC tablet Take 325 mg by mouth 3 (three) times daily with meals.   Yes [provider]  Prenatal Vit-Fe Fumarate-FA (PRENATAL PO) Prenatal   Yes [provider]    Allergies  Allergen Reactions   Guaifenesin Hives, Itching and Swelling    And hives     Ibuprofen Hives, Itching and Swelling   Measles And Rubella Virus Vaccine Rash    MMR- advised not to have 2nd- had rash, swelling and high fever     Social History   Socioeconomic History   Marital status: Married    Spouse name: Not on file   Number of children: Not on file   Years of education: Not on file   Highest education level: Not on file  Occupational History   Not on file  Tobacco Use   Smoking status: Never   Smokeless tobacco: Never  Vaping Use   Vaping Use: Never used  Substance and Sexual Activity   Alcohol use: Never    Comment: occasionally   Drug use: Not on file   Sexual activity: Yes    Birth control/protection: Condom  Other Topics Concern   Not on file  Social History Narrative   Not on file   Social Determinants of Health   Financial Resource Strain: Not on file  Food Insecurity: Not on file  Transportation Needs: Not on file  Physical Activity: Not on file  Stress: Not on file  Social Connections: Not on file  Intimate Partner Violence: Not on file    Family History  Problem Relation Age of Onset   Esophageal cancer Father    Breast cancer Maternal Grandmother    Heart attack Maternal Grandfather    Breast cancer Paternal Grandmother  Stroke Paternal Grandfather    Heart attack Paternal Grandfather     ROS   Physical Exam BP 118/70   Ht _0  (1.753 m)   Wt 201 lb (91.2 kg)   LMP 08/04/2022   Breastfeeding Yes   BMI 29.68 kg/m   Physical Exam Constitutional:      Appearance: Normal appearance.  Genitourinary:     Genitourinary Comments: Vulva red with atrophic labia consistent with low estrogen state r/t lactation  Laceration well healed. Urethra WNL no lesions  Bimanual exam: non gravid, nn tender no masses, adnexa non tender no masses.    Cardiovascular:     Rate and Rhythm: Normal rate and regular rhythm.     Pulses: Normal pulses.     Heart sounds: Normal heart sounds.  Pulmonary:     Effort: Pulmonary effort is  normal.     Breath sounds: Normal breath sounds.  Chest:     Comments: Breasts: lactating, no redness or masses.  Nipples erect and intact bilaterally.  Abdominal:     Tenderness: There is no abdominal tenderness.  Musculoskeletal:     Cervical back: Normal range of motion and neck supple.  Neurological:     Mental Status: She is alert.      Female Chaperone present during breast and/or pelvic exam.  Assessment: 36 y.o. [id number] presenting for 6 week postpartum visit  Plan: Problem List Items Addressed This Visit   None Visit Diagnoses     Care and examination of lactating mother    -  Primary   Relevant Medications   conjugated estrogens (PREMARIN) vaginal cream   Other Relevant Orders   CBC (Completed)   Vaginal irritation       Relevant Medications   conjugated estrogens (PREMARIN) vaginal cream   Other Relevant Orders   Cervicovaginal ancillary only (Completed)        1) Contraception Education given regarding options for contraception, including IUD placement.  2)  Pap - ASCCP guidelines and rational discussed.  Patient opts for 5 year screening interval  3) Patient underwent screening for postpartum depression with no concerns noted.  4) Follow up 1 year for routine annual exam  5) Premarin cream for vaginal atrophy related to breastfeeding   Roberto Scales, [place], [facility]  09/17/22  10:54 AM

## 2022-09-15 LAB — CBC
Hematocrit: 37.6 % (ref 34.0–46.6)
Hemoglobin: 12.5 g/dL (ref 11.1–15.9)
MCH: 29.3 pg (ref 26.6–33.0)
MCHC: 33.2 g/dL (ref 31.5–35.7)
MCV: 88 fL (ref 79–97)
Platelets: 352 10*3/uL (ref 150–450)
RBC: 4.27 x10E6/uL (ref 3.77–5.28)
RDW: 13 % (ref 11.7–15.4)
WBC: 6.5 10*3/uL (ref 3.4–10.8)

## 2022-09-15 LAB — CERVICOVAGINAL ANCILLARY ONLY
Bacterial Vaginitis (gardnerella): NEGATIVE
Candida Glabrata: NEGATIVE
Candida Vaginitis: NEGATIVE
Comment: NEGATIVE
Comment: NEGATIVE
Comment: NEGATIVE

## 2022-09-18 ENCOUNTER — Encounter: Payer: Self-pay | Admitting: Obstetrics & Gynecology

## 2022-09-18 NOTE — Progress Notes (Unsigned)
Subjective:    Janet Ruiz is a 36 y.o. female being seen today for her obstetrical visit. She is at [redacted]w[redacted]d gestation. Patient reports no bleeding, no contractions, no cramping, and no leaking. Fetal movement: normal.  Menstrual History: OB History as of 08/04/2022     Gravida  3   Para  2   Term  2   Preterm      AB  1   Living  2      SAB  1   IAB      Ectopic      Multiple  0   Live Births  1           Menarche age: 39 Patient's last menstrual period was 10/20/2021.    The following portions of the patient's history were reviewed and updated as appropriate: allergies, current medications, past family history, past medical history, past social history, past surgical history, and problem list.  Review of Systems A comprehensive review of systems was negative.   Objective:    BP 112/80   Wt 232 lb 9.6 oz (105.5 kg)   LMP 10/20/2021   BMI 34.35 kg/m  FHT:  40 cm BPM: 142  Uterine Size: 40 cm  Presentations: cephalic  Pelvic Exam:    Assessment:    Pregnancy 40 and 6/7 weeks   Plan:   Supervision of high risk pregnancy Return as scheduled  Rosario Adie, MD  09/18/2022 5:33 PM-cj

## 2022-11-24 ENCOUNTER — Emergency Department
Admission: EM | Admit: 2022-11-24 | Discharge: 2022-11-24 | Disposition: A | Discharge status: Home / Self Care | Payer: 59 | Attending: Emergency Medicine | Admitting: Emergency Medicine

## 2022-11-24 DIAGNOSIS — Z1152 Encounter for screening for COVID-19: Secondary | ICD-10-CM | POA: Insufficient documentation

## 2022-11-24 DIAGNOSIS — T7840XA Allergy, unspecified, initial encounter: Secondary | ICD-10-CM | POA: Diagnosis present

## 2022-11-24 LAB — RESP PANEL BY RT-PCR (FLU A&B, COVID) ARPGX2
Influenza A by PCR: NEGATIVE
Influenza B by PCR: NEGATIVE
SARS Coronavirus 2 by RT PCR: NEGATIVE

## 2022-11-24 MED ORDER — EPINEPHRINE 0.3 MG/0.3ML IJ SOAJ: 0.3000 mg | INTRAMUSCULAR | 1 refills | Status: AC | PRN

## 2022-11-24 MED ORDER — DEXAMETHASONE 2 MG PO TABS: 10.0000 mg | ORAL_TABLET | Freq: Once | ORAL | 0 refills | Status: AC

## 2022-11-24 MED ORDER — EPINEPHRINE 0.3 MG/0.3ML IJ SOAJ: 0.3000 mg | INTRAMUSCULAR | 1 refills | Status: DC | PRN

## 2022-11-24 MED ORDER — DEXAMETHASONE 2 MG PO TABS: 10.0000 mg | ORAL_TABLET | Freq: Once | ORAL | 0 refills | Status: DC

## 2022-11-24 NOTE — ED Triage Notes (Signed)
Ambulatory to triage with c/o facial swelling. Utilized teleheath tonight and was told by MD to come to ED for evaluation. States had hives yesterday and reaction progressed to swelling on lips and edema to hands.  Denies exposure to known allergens, Denies any change in environmental factors. Pt currently breastfeeding and has been unable to take Benadryl for sx.  Resp even and unlabored. Pt able to cough and control secretions. Airway patent with no signs of impending compromise.  Has had congestion and cough past several days. Recently exposed to child with RSV

## 2022-11-24 NOTE — Discharge Instructions (Addendum)
You may take Benadryl 50 mg every 6 hours as needed for swelling, hives.  I have her place a referral to allergy specialist in [place].  If you have not heard back from them in 2 days you may call them at [phone].  You may also contact [facility] in [place] at [phone].  I am not able to place a direct referral to them.

## 2022-11-24 NOTE — ED Provider Notes (Signed)
[facility] Provider Note    Event Date/Time   First MD Initiated Contact with Patient 11/24/22 [phone]     (approximate)   History   Allergic Reaction   HPI  Janet Ruiz is a 36 y.o. female with no significant past medical history who presents to the emergency department with intermittent hives and then over the past 2 days intermittent swelling around her eyes and swelling of her upper and lower lip.  No difficulty swallowing, speaking, breathing.  She is not aware of any new exposures.  She has never had symptoms like this before.  She was sick a week ago with URI symptoms and she had a child that tested positive for RSV.  She is breast-feeding so therefore reluctant to take antihistamines as she does not want to decrease her supply.   History provided by patient and mother.    Past Medical History:  Diagnosis Date   No known health problems     Past Surgical History:  Procedure Laterality Date   DILATION AND CURETTAGE OF UTERUS  2017   after miscarriage    MEDICATIONS:  Prior to Admission medications   Medication Sig Start Date End Date Taking? Authorizing Provider  clindamycin (CLEOCIN T) 1 % lotion Apply topically every morning. 04/20/21   [provider]  conjugated estrogens (PREMARIN) vaginal cream Place 1 Applicatorful vaginally daily. Apply daily times 2 weeks, then apply 2 to 3 times per week 09/14/22   Dominic, Courtney Heys, CNM  docusate sodium (COLACE) 100 MG capsule Take 100 mg by mouth 2 (two) times daily.    [provider]  ferrous sulfate 325 (65 FE) MG EC tablet Take 325 mg by mouth 3 (three) times daily with meals.    [provider]  Prenatal Vit-Fe Fumarate-FA (PRENATAL PO) Prenatal    [provider]    Physical Exam   Triage Vital Signs: ED Triage Vitals [11/24/22 0209]  Enc Vitals Group     BP 133/63     Pulse Rate 85     Resp 18     Temp 98.4 F (36.9 C)     Temp Source Oral      SpO2 99 %     Weight 200 lb (90.7 kg)     Height 5\' 9"  (1.753 m)     Head Circumference      Peak Flow      Pain Score      Pain Loc      Pain Edu?      Excl. in GC?     Most recent vital signs: Vitals:   11/24/22 0209 11/24/22 0608  BP: 133/63 120/63  Pulse: 85 91  Resp: 18 18  Temp: 98.4 F (36.9 C)   SpO2: 99% 98%    CONSTITUTIONAL: Alert and oriented and responds appropriately to questions. Well-appearing; well-nourished HEAD: Normocephalic, atraumatic EYES: Conjunctivae clear, pupils appear equal, sclera nonicteric ENT: normal nose; moist mucous membranes, no angioedema, posterior oropharynx is patent, normal phonation, no stridor, no trismus, no drooling CARD: RRR; S1 and S2 appreciated; no murmurs, no clicks, no rubs, no gallops RESP: Normal chest excursion without splinting or tachypnea; breath sounds clear and equal bilaterally; no wheezes, no rhonchi, no rales, no hypoxia or respiratory distress, speaking full sentences SKIN: Normal color for age and race; warm; no rash on exposed skin    ED Results / Procedures / Treatments   LABS: (all labs ordered are listed, but only abnormal results  are displayed) Labs Reviewed  RESP PANEL BY RT-PCR (FLU A&B, COVID) [id number]     EKG:   RADIOLOGY: My personal review and interpretation of imaging:    I have personally reviewed all radiology reports.   No results found.   PROCEDURES:  Critical Care performed: No      Procedures    IMPRESSION / MDM / ASSESSMENT AND PLAN / ED COURSE  I reviewed the triage vital signs and the nursing notes.    Patient here with symptoms of allergic reaction that have improved while in the waiting room.    DIFFERENTIAL DIAGNOSIS (includes but not limited to):   allergic reaction, not on ACEI or ARB to suggest angioedema from meds, no family history to suggest genetic component   Patient's presentation is most consistent with acute presentation with potential threat to  life or bodily function.   PLAN: Patient here with intermittent symptoms of allergic reaction that have now resolved.  I have offered her steroids admitted her here but we discussed that this could decrease her breastmilk supply.  She would prefer to hold off at this time given she is feeling better.  She is comfortable with going home with a prescription for an EpiPen and one-time dose of Decadron for home if symptoms return and will also keep Benadryl at home.  Discussed with her how to administer and when to administer epinephrine.  Will place referral to an allergy specialist as well.  Discussed at length return precautions.  Patient and mother comfortable with this plan.   MEDICATIONS GIVEN IN ED: Medications - No data to display   ED COURSE:  At this time, I do not feel there is any life-threatening condition present. I reviewed all nursing notes, vitals, pertinent previous records.  All lab and urine results, EKGs, imaging ordered have been independently reviewed and interpreted by myself.  I reviewed all available radiology reports from any imaging ordered this visit.  Based on my assessment, I feel the patient is safe to be discharged home without further emergent workup and can continue workup as an outpatient as needed. Discussed all findings, treatment plan as well as usual and customary return precautions.  They verbalize understanding and are comfortable with this plan.  Outpatient follow-up has been provided as needed.  All questions have been answered.    CONSULTS:  none   OUTSIDE RECORDS REVIEWED:  none       FINAL CLINICAL IMPRESSION(S) / ED DIAGNOSES   Final diagnoses:  Allergic reaction, initial encounter     Rx / DC Orders   ED Discharge Orders          Ordered    Ambulatory referral to Allergy        11/24/22 0551    dexamethasone (DECADRON) 2 MG tablet   Once,   Status:  Discontinued        11/24/22 0553    EPINEPHrine 0.3 mg/0.3 mL IJ SOAJ injection  As  needed,   Status:  Discontinued        11/24/22 0553    dexamethasone (DECADRON) 2 MG tablet   Once        11/24/22 4098    EPINEPHrine 0.3 mg/0.3 mL IJ SOAJ injection  As needed        11/24/22 1191             Note:  This document was prepared using Dragon voice recognition software and may include unintentional dictation errors.   Shelle Galdamez, Layla Maw,  DO 11/24/22 5170

## 2023-03-02 ENCOUNTER — Ambulatory Visit (INDEPENDENT_AMBULATORY_CARE_PROVIDER_SITE_OTHER): Payer: 59 | Admitting: Obstetrics & Gynecology

## 2023-03-02 ENCOUNTER — Telehealth: Payer: Self-pay

## 2023-03-02 ENCOUNTER — Encounter: Payer: Self-pay | Admitting: Obstetrics & Gynecology

## 2023-03-02 VITALS — BP 100/60 | Temp 99.8°F | Ht 69.0 in | Wt 197.0 lb

## 2023-03-02 DIAGNOSIS — N61 Mastitis without abscess: Secondary | ICD-10-CM

## 2023-03-02 MED ORDER — DICLOXACILLIN SODIUM 250 MG PO CAPS: 250.0000 mg | ORAL_CAPSULE | Freq: Four times a day (QID) | ORAL | 0 refills | Status: AC

## 2023-03-02 NOTE — Progress Notes (Signed)
    GYNECOLOGY PROGRESS NOTE  Subjective:    Patient ID: Janet Ruiz, female    DOB: [birth year], 37 y.o.   MRN: [id number]  HPI  Patient is a 37 y.o. [id number] who presents with new onset (today) left beast pain (which she believes is mastitis). She doesn't think that she has a fever but feels a little "achy". She is breast feeding her 36 month old, still about 10 times per day. Baby is eating some baby food, awakes about 2 times per night for nursing.     Review of Systems  Pap normal 2022  Objective:   Blood pressure 100/60, height 5\' 9"  (1.753 m), weight 197 lb (89.4 kg), currently breastfeeding. Body mass index is 29.09 kg/m. General appearance: alert Abdomen: soft, non-tender; bowel sounds normal; no masses,  no organomegaly Breast exam: both breasts fairly engorged with milk. There is a 3 cm  erythematous area on the left breast, medial to the nipple, almost at the edge of the breast. It is slightly warmer than the surrounding tissues. Extremities: extremities normal, atraumatic, no cyanosis or edema Neurologic: Grossly normal   Assessment:   1. Mastitis     - very early case - rec warm compresses, thorough use of breast pump - dicloxacillin prescribed  Plan:   1. Mastitis - as above

## 2023-03-02 NOTE — Telephone Encounter (Signed)
The patient is scheduled with MD at 9:55. The patient was advise to come on in

## 2023-11-10 ENCOUNTER — Other Ambulatory Visit: Payer: Self-pay | Admitting: Medical Genetics

## 2023-11-10 DIAGNOSIS — Z006 Encounter for examination for normal comparison and control in clinical research program: Secondary | ICD-10-CM

## 2023-11-12 ENCOUNTER — Other Ambulatory Visit
Admission: RE | Admit: 2023-11-12 | Discharge: 2023-11-12 | Disposition: A | Discharge status: Home / Self Care | Payer: Self-pay | Source: Ambulatory Visit | Attending: Medical Genetics | Admitting: Medical Genetics

## 2023-11-12 ENCOUNTER — Other Ambulatory Visit: Payer: Self-pay

## 2023-11-12 DIAGNOSIS — Z006 Encounter for examination for normal comparison and control in clinical research program: Secondary | ICD-10-CM | POA: Insufficient documentation

## 2023-12-02 LAB — GENECONNECT MOLECULAR SCREEN: Genetic Analysis Overall Interpretation: NEGATIVE
